# Patient Record
Sex: Female | Born: 1990 | Race: White | Hispanic: No | Marital: Single | Smoking: Current every day smoker
Health system: Southern US, Community
[De-identification: ages and names within clinical notes are randomized; demographics above are authoritative.]

## PROBLEM LIST (undated history)

## (undated) HISTORY — PX: SHOULDER SURGERY: SHX246

## (undated) HISTORY — PX: KIDNEY STONE SURGERY: SHX686

## (undated) HISTORY — PX: CYST EXCISION: SHX5701

## (undated) HISTORY — PX: TONSILLECTOMY: SUR1361

---

## 2018-02-01 ENCOUNTER — Emergency Department (HOSPITAL_BASED_OUTPATIENT_CLINIC_OR_DEPARTMENT_OTHER)
Admission: EM | Admit: 2018-02-01 | Discharge: 2018-02-01 | Disposition: A | Discharge status: Home / Self Care | Payer: BLUE CROSS/BLUE SHIELD | Attending: Emergency Medicine | Admitting: Emergency Medicine

## 2018-02-01 ENCOUNTER — Other Ambulatory Visit: Payer: Self-pay

## 2018-02-01 ENCOUNTER — Encounter (HOSPITAL_BASED_OUTPATIENT_CLINIC_OR_DEPARTMENT_OTHER): Payer: Self-pay | Admitting: Emergency Medicine

## 2018-02-01 ENCOUNTER — Emergency Department (HOSPITAL_BASED_OUTPATIENT_CLINIC_OR_DEPARTMENT_OTHER): Payer: BLUE CROSS/BLUE SHIELD

## 2018-02-01 DIAGNOSIS — R197 Diarrhea, unspecified: Secondary | ICD-10-CM | POA: Diagnosis not present

## 2018-02-01 DIAGNOSIS — R1084 Generalized abdominal pain: Secondary | ICD-10-CM | POA: Diagnosis not present

## 2018-02-01 DIAGNOSIS — F17200 Nicotine dependence, unspecified, uncomplicated: Secondary | ICD-10-CM | POA: Diagnosis not present

## 2018-02-01 LAB — URINALYSIS, ROUTINE W REFLEX MICROSCOPIC
Bilirubin Urine: NEGATIVE
GLUCOSE, UA: NEGATIVE mg/dL
Ketones, ur: NEGATIVE mg/dL
LEUKOCYTES UA: NEGATIVE
NITRITE: NEGATIVE
PROTEIN: NEGATIVE mg/dL
Specific Gravity, Urine: 1.01 (ref 1.005–1.030)
pH: 6.5 (ref 5.0–8.0)

## 2018-02-01 LAB — COMPREHENSIVE METABOLIC PANEL
ALK PHOS: 84 U/L (ref 38–126)
ALT: 9 U/L — ABNORMAL LOW (ref 14–54)
AST: 15 U/L (ref 15–41)
Albumin: 4.2 g/dL (ref 3.5–5.0)
Anion gap: 10 (ref 5–15)
BILIRUBIN TOTAL: 0.3 mg/dL (ref 0.3–1.2)
BUN: 11 mg/dL (ref 6–20)
CALCIUM: 9.3 mg/dL (ref 8.9–10.3)
CO2: 24 mmol/L (ref 22–32)
Chloride: 104 mmol/L (ref 101–111)
Creatinine, Ser: 0.63 mg/dL (ref 0.44–1.00)
Glucose, Bld: 87 mg/dL (ref 65–99)
POTASSIUM: 3.8 mmol/L (ref 3.5–5.1)
Sodium: 138 mmol/L (ref 135–145)
TOTAL PROTEIN: 7.1 g/dL (ref 6.5–8.1)

## 2018-02-01 LAB — CBC WITH DIFFERENTIAL/PLATELET
BASOS ABS: 0 10*3/uL (ref 0.0–0.1)
BASOS PCT: 1 %
EOS ABS: 0.1 10*3/uL (ref 0.0–0.7)
Eosinophils Relative: 2 %
HEMATOCRIT: 41.1 % (ref 36.0–46.0)
HEMOGLOBIN: 14.3 g/dL (ref 12.0–15.0)
Lymphocytes Relative: 33 %
Lymphs Abs: 2.4 10*3/uL (ref 0.7–4.0)
MCH: 32.8 pg (ref 26.0–34.0)
MCHC: 34.8 g/dL (ref 30.0–36.0)
MCV: 94.3 fL (ref 78.0–100.0)
Monocytes Absolute: 0.7 10*3/uL (ref 0.1–1.0)
Monocytes Relative: 10 %
NEUTROS ABS: 3.9 10*3/uL (ref 1.7–7.7)
NEUTROS PCT: 54 %
Platelets: 219 10*3/uL (ref 150–400)
RBC: 4.36 MIL/uL (ref 3.87–5.11)
RDW: 11.3 % — AB (ref 11.5–15.5)
WBC: 7.2 10*3/uL (ref 4.0–10.5)

## 2018-02-01 LAB — URINALYSIS, MICROSCOPIC (REFLEX)
RBC / HPF: NONE SEEN RBC/hpf (ref 0–5)
WBC UA: NONE SEEN WBC/hpf (ref 0–5)

## 2018-02-01 LAB — LIPASE, BLOOD: LIPASE: 25 U/L (ref 11–51)

## 2018-02-01 LAB — PREGNANCY, URINE: PREG TEST UR: NEGATIVE

## 2018-02-01 MED ORDER — SODIUM CHLORIDE 0.9 % IV BOLUS
1000.0000 mL | Freq: Once | INTRAVENOUS | Status: AC
  Administered 2018-02-01: 1000 mL via INTRAVENOUS

## 2018-02-01 MED ORDER — DICYCLOMINE HCL 10 MG PO CAPS
10.0000 mg | ORAL_CAPSULE | Freq: Once | ORAL | Status: AC
  Administered 2018-02-01: 10 mg via ORAL
  Filled 2018-02-01: qty 1

## 2018-02-01 MED ORDER — IOPAMIDOL (ISOVUE-300) INJECTION 61%
100.0000 mL | Freq: Once | INTRAVENOUS | Status: AC | PRN
  Administered 2018-02-01: 100 mL via INTRAVENOUS

## 2018-02-01 MED ORDER — DICYCLOMINE HCL 20 MG PO TABS
20.0000 mg | ORAL_TABLET | Freq: Three times a day (TID) | ORAL | 0 refills | Status: AC
Start: 2018-02-01 — End: ?

## 2018-02-01 NOTE — ED Triage Notes (Signed)
Pt c/o diarrhea since yesterday. Reports 15 episodes today. Denies vomiting.

## 2018-02-01 NOTE — ED Provider Notes (Signed)
[facility] EMERGENCY DEPARTMENT Provider Note   CSN: [id number] Arrival date & time: 02/01/18  1840     History   Chief Complaint Chief Complaint  Patient presents with  . Diarrhea    HPI Carolyn Deleon is a 27 y.o. female who presents today for evaluation of approximately 15 episodes of diarrhea since 2 AM this morning.  She denies nausea or vomiting, states that after multiple episodes of diarrhea her stomach has started hurting.  She denies any fevers at home.  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  She denies knowingly eating any red meat.  Her diarrhea is nonbloody.  She denies any dysuria or hematuria, no increased frequency or urgency.  She denies any vaginal symptoms, no concern for STI/STD, no abnormal vaginal discharge or bleeding.  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    HPI  History reviewed. No pertinent past medical history.  There are no active problems to display for this patient.   Past Surgical History:  Procedure Laterality Date  . TONSILLECTOMY       OB History   None      Home Medications    Prior to Admission medications   Medication Sig Start Date End Date Taking? Authorizing Provider  dicyclomine (BENTYL) 20 MG tablet Take 1 tablet (20 mg total) by mouth 3 (three) times daily before meals. As needed 02/01/18   Cristina GongHammond, Deeanne Deininger W, PA-C    Family History No family history on file.  Social History Social History   Tobacco Use  . Smoking status: Current Every Day Smoker  . Smokeless tobacco: Never Used  Substance Use Topics  . Alcohol use: Not on file  . Drug use: Not Currently    Comment: former opiate addiction     Allergies   Patient has no known allergies.   Review of Systems Review of Systems  Constitutional: Negative for chills  and fever.  Respiratory: Negative for cough and shortness of breath.   Gastrointestinal: Positive for abdominal pain and diarrhea. Negative for nausea and vomiting.  Genitourinary: Negative for dysuria, frequency, genital sores, hematuria, menstrual problem, urgency, vaginal bleeding, vaginal discharge and vaginal pain.  Musculoskeletal: Negative for back pain and neck pain.  Skin: Negative for rash.  Allergic/Immunologic: Negative for immunocompromised state.  Neurological: Negative for weakness, light-headedness and headaches.  Psychiatric/Behavioral: Negative for confusion.  All other systems reviewed and are negative.    Physical Exam Updated Vital Signs BP 116/69 (BP Location: Left Arm)   Pulse 70   Temp 98.4 F (36.9 C) (Oral)   Resp 16   Ht 5\' 8"  (1.727 m)   Wt 59 kg (130 lb)   SpO2 100%   BMI 19.77 kg/m   Physical Exam  Constitutional: She is oriented to person, place, and time. She appears well-developed and well-nourished. No distress.  HENT:  Head: Normocephalic and atraumatic.  Mouth/Throat: Oropharynx is clear and moist.  Eyes: Conjunctivae are normal. Right eye exhibits no discharge. Left eye exhibits no discharge. No scleral icterus.  Neck: Normal range of motion.  Cardiovascular: Normal rate, regular rhythm, normal heart sounds and intact distal pulses.  Pulmonary/Chest: Effort normal. No stridor. No respiratory distress.  Abdominal: Soft. Normal appearance. She exhibits no distension and no mass. Bowel sounds are increased.  There is generalized tenderness and tenderness in the right lower quadrant. There is tenderness at McBurney's point. There is no rebound and no guarding.  Generalized abdominal tenderness, worse in right lower quadrant.   Genitourinary:  Genitourinary Comments: Exam refused by patient.  Musculoskeletal: She exhibits no edema or deformity.  Neurological: She is alert and oriented to person, place, and time. She exhibits normal muscle tone.    Skin: Skin is warm and dry. She is not diaphoretic.  Psychiatric: She has a normal mood and affect. Her behavior is normal.  Nursing note and vitals reviewed.    ED Treatments / Results  Labs (all labs ordered are listed, but only abnormal results are displayed) Labs Reviewed  URINALYSIS, ROUTINE W REFLEX MICROSCOPIC - Abnormal; Notable for the following components:      Result Value   Color, Urine STRAW (*)    Hgb urine dipstick TRACE (*)    All other components within normal limits  URINALYSIS, MICROSCOPIC (REFLEX) - Abnormal; Notable for the following components:   Bacteria, UA FEW (*)    Squamous Epithelial / LPF 0-5 (*)    All other components within normal limits  COMPREHENSIVE METABOLIC PANEL - Abnormal; Notable for the following components:   ALT 9 (*)    All other components within normal limits  CBC WITH DIFFERENTIAL/PLATELET - Abnormal; Notable for the following components:   RDW 11.3 (*)    All other components within normal limits  URINE CULTURE  PREGNANCY, URINE  LIPASE, BLOOD    EKG None  Radiology Ct Abdomen Pelvis W Contrast  Result Date: 02/01/2018 CLINICAL DATA:  Diarrhea since this morning. Generalized abdominal pain. EXAM: CT ABDOMEN AND PELVIS WITH CONTRAST TECHNIQUE: Multidetector CT imaging of the abdomen and pelvis was performed using the standard protocol following bolus administration of intravenous contrast. CONTRAST:  ISOVUE-300 IOPAMIDOL (ISOVUE-300) INJECTION 61% COMPARISON:  None. FINDINGS: Lower chest: Mild dependent changes in the lung bases. Hepatobiliary: No focal liver abnormality is seen. No gallstones, gallbladder wall thickening, or biliary dilatation. Pancreas: Unremarkable. No pancreatic ductal dilatation or surrounding inflammatory changes. Spleen: Normal in size without focal abnormality. Adrenals/Urinary Tract: Adrenal glands are unremarkable. Kidneys are normal, without renal calculi, focal lesion, or hydronephrosis. Bladder is  unremarkable. Stomach/Bowel: Stomach, small bowel, and colon are not abnormally distended. No wall thickening or inflammatory infiltration is identified. Appendix is normal. Vascular/Lymphatic: No significant vascular findings are present. No enlarged abdominal or pelvic lymph nodes. Reproductive: Uterus and bilateral adnexa are unremarkable. Small amount of free fluid in the pelvis is likely to be physiologic. Other: No free air in the abdomen. Abdominal wall musculature appears intact. Musculoskeletal: No acute or significant osseous findings. IMPRESSION: 1. No acute process demonstrated in the abdomen or pelvis. No evidence of bowel obstruction or inflammation. 2. Minimal free fluid in the pelvis is likely physiologic. Electronically Signed   By: Burman Nieves M.D.   On: 02/01/2018 21:56    Procedures Procedures (including critical care time)  Medications Ordered in ED Medications  sodium chloride 0.9 % bolus 1,000 mL (1,000 mLs Intravenous New Bag/Given 02/01/18 2121)  sodium chloride 0.9 % bolus 1,000 mL (1,000 mLs Intravenous New Bag/Given 02/01/18 2124)  dicyclomine (BENTYL) capsule 10 mg (10 mg Oral Given 02/01/18 2123)  iopamidol (ISOVUE-300) 61 % injection 100 mL (100 mLs Intravenous Contrast Given 02/01/18 2137)     Initial Impression / Assessment and Plan / ED Course  I have reviewed the triage vital signs and the nursing notes.  Pertinent labs & imaging results that were available during my care of the patient were reviewed by me and considered in my medical decision making (see chart for details).    Patient is nontoxic, nonseptic appearing, in no apparent distress.  Patient's pain and other symptoms adequately managed in emergency department.  Fluid bolus given.  Labs, imaging and vitals reviewed.  Patient does not meet the SIRS or Sepsis criteria.  Patient has had over 15 episodes of nonbloody diarrhea today.  She has generalized abdominal tenderness to palpation, however it is worse  in the right lower quadrant, patient has never had any abdominal surgeries before.  Her abdominal pain was treated with Bentyl and CAT scan was obtained to rule out appendicitis.  CAT scan unremarkable, no evidence of appendicitis.  On repeat exam after Bentyl, fluids, patient reported feeling significantly better, she passed p.o. challenge without difficulty.  We discussed return precautions, and she stated her understanding.  We discussed risk benefits of Bentyl, and after discussion she will be discharged home with a short course of Bentyl.  She was instructed to make sure she stays well-hydrated.  Patient discharged home.    Final Clinical Impressions(s) / ED Diagnoses   Final diagnoses:  Diarrhea, unspecified type  Generalized abdominal pain    ED Discharge Orders        Ordered    dicyclomine (BENTYL) 20 MG tablet  3 times daily before meals     02/01/18 2303       Cristina Gong, [place] 02/01/18 2321    Arby Barrette, MD 02/07/18 1526

## 2018-02-01 NOTE — ED Notes (Signed)
Patient stated that she can not have any opiates because she is a recovering addict.

## 2018-02-01 NOTE — Discharge Instructions (Addendum)
Please make sure you are staying well hydrated.  Please follow up with your doctor as needed.  If you have any additional concerns or your diarrhea continues please seek additional medical evaluation.

## 2018-02-03 LAB — URINE CULTURE

## 2018-03-22 ENCOUNTER — Other Ambulatory Visit: Payer: Self-pay

## 2018-03-22 ENCOUNTER — Encounter (HOSPITAL_BASED_OUTPATIENT_CLINIC_OR_DEPARTMENT_OTHER): Payer: Self-pay | Admitting: *Deleted

## 2018-03-22 ENCOUNTER — Emergency Department (HOSPITAL_BASED_OUTPATIENT_CLINIC_OR_DEPARTMENT_OTHER)
Admission: EM | Admit: 2018-03-22 | Discharge: 2018-03-22 | Disposition: A | Discharge status: Home / Self Care | Payer: Worker's Compensation | Attending: Emergency Medicine | Admitting: Emergency Medicine

## 2018-03-22 DIAGNOSIS — Y990 Civilian activity done for income or pay: Secondary | ICD-10-CM | POA: Diagnosis not present

## 2018-03-22 DIAGNOSIS — Y939 Activity, unspecified: Secondary | ICD-10-CM | POA: Diagnosis not present

## 2018-03-22 DIAGNOSIS — T24512A Corrosion of first degree of left thigh, initial encounter: Secondary | ICD-10-CM | POA: Diagnosis not present

## 2018-03-22 DIAGNOSIS — T24412A Corrosion of unspecified degree of left thigh, initial encounter: Secondary | ICD-10-CM | POA: Diagnosis present

## 2018-03-22 DIAGNOSIS — Y929 Unspecified place or not applicable: Secondary | ICD-10-CM | POA: Insufficient documentation

## 2018-03-22 DIAGNOSIS — Y33XXXA Other specified events, undetermined intent, initial encounter: Secondary | ICD-10-CM | POA: Insufficient documentation

## 2018-03-22 DIAGNOSIS — F17210 Nicotine dependence, cigarettes, uncomplicated: Secondary | ICD-10-CM | POA: Diagnosis not present

## 2018-03-22 DIAGNOSIS — T24511A Corrosion of first degree of right thigh, initial encounter: Secondary | ICD-10-CM | POA: Insufficient documentation

## 2018-03-22 DIAGNOSIS — T304 Corrosion of unspecified body region, unspecified degree: Secondary | ICD-10-CM

## 2018-03-22 DIAGNOSIS — T65891A Toxic effect of other specified substances, accidental (unintentional), initial encounter: Secondary | ICD-10-CM | POA: Insufficient documentation

## 2018-03-22 MED ORDER — SILVER SULFADIAZINE 1 % EX CREA
TOPICAL_CREAM | Freq: Once | CUTANEOUS | Status: AC
  Administered 2018-03-22: 1 via TOPICAL
  Filled 2018-03-22: qty 85
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
## 2018-03-24 ENCOUNTER — Emergency Department (HOSPITAL_BASED_OUTPATIENT_CLINIC_OR_DEPARTMENT_OTHER)
Admission: EM | Admit: 2018-03-24 | Discharge: 2018-03-24 | Disposition: A | Discharge status: Home / Self Care | Payer: Worker's Compensation | Attending: Emergency Medicine | Admitting: Emergency Medicine

## 2018-03-24 ENCOUNTER — Encounter (HOSPITAL_BASED_OUTPATIENT_CLINIC_OR_DEPARTMENT_OTHER): Payer: Self-pay

## 2018-03-24 ENCOUNTER — Other Ambulatory Visit: Payer: Self-pay

## 2018-03-24 DIAGNOSIS — Y929 Unspecified place or not applicable: Secondary | ICD-10-CM | POA: Insufficient documentation

## 2018-03-24 DIAGNOSIS — T24611D Corrosion of second degree of right thigh, subsequent encounter: Secondary | ICD-10-CM

## 2018-03-24 DIAGNOSIS — T542X1D Toxic effect of corrosive acids and acid-like substances, accidental (unintentional), subsequent encounter: Secondary | ICD-10-CM | POA: Diagnosis not present

## 2018-03-24 DIAGNOSIS — X58XXXD Exposure to other specified factors, subsequent encounter: Secondary | ICD-10-CM | POA: Diagnosis not present

## 2018-03-24 DIAGNOSIS — F17210 Nicotine dependence, cigarettes, uncomplicated: Secondary | ICD-10-CM | POA: Insufficient documentation

## 2018-03-24 DIAGNOSIS — T24612D Corrosion of second degree of left thigh, subsequent encounter: Secondary | ICD-10-CM | POA: Insufficient documentation

## 2018-03-24 DIAGNOSIS — L03116 Cellulitis of left lower limb: Secondary | ICD-10-CM | POA: Diagnosis not present

## 2018-03-24 DIAGNOSIS — T24039D Burn of unspecified degree of unspecified lower leg, subsequent encounter: Secondary | ICD-10-CM | POA: Diagnosis present

## 2018-03-24 MED ORDER — CEPHALEXIN 500 MG PO CAPS: 500.0000 mg | ORAL_CAPSULE | Freq: Four times a day (QID) | ORAL | 0 refills | Status: AC

## 2018-03-24 NOTE — Discharge Instructions (Addendum)
Thank you for allowing me to care of you today in the emergency department.  Take 1 tablet of Keflex every 6 hours for the next 7 days.  The redness has been marked with a skin marker today in the emergency department.  After you take in 3-4 doses of the antibiotics, he should not have any  extension of redness.  Continue to use the Silvadene cream as prescribed.  Keep the area clean with warm soap and water.  You can apply a bandage to the area to keep it from rubbing against the material on your pants.  MSDS numbers were [id number] which is tetrapotassium pyrophosphate, potassium hydroxide, and edta.  The 2nd one was [id number] which is ammonia.   Return to the emergency department if your symptoms worsen despite being on antibiotics, if you develop fever chills, or other new concerning symptoms.
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
## 2018-06-09 ENCOUNTER — Encounter (HOSPITAL_BASED_OUTPATIENT_CLINIC_OR_DEPARTMENT_OTHER): Payer: Self-pay | Admitting: Emergency Medicine

## 2018-06-09 ENCOUNTER — Emergency Department (HOSPITAL_BASED_OUTPATIENT_CLINIC_OR_DEPARTMENT_OTHER)
Admission: EM | Admit: 2018-06-09 | Discharge: 2018-06-09 | Disposition: A | Discharge status: Home / Self Care | Payer: Worker's Compensation | Attending: Emergency Medicine | Admitting: Emergency Medicine

## 2018-06-09 ENCOUNTER — Other Ambulatory Visit: Payer: Self-pay

## 2018-06-09 ENCOUNTER — Emergency Department (HOSPITAL_BASED_OUTPATIENT_CLINIC_OR_DEPARTMENT_OTHER): Payer: Worker's Compensation

## 2018-06-09 DIAGNOSIS — F17200 Nicotine dependence, unspecified, uncomplicated: Secondary | ICD-10-CM | POA: Diagnosis not present

## 2018-06-09 DIAGNOSIS — Y929 Unspecified place or not applicable: Secondary | ICD-10-CM | POA: Insufficient documentation

## 2018-06-09 DIAGNOSIS — S61215A Laceration without foreign body of left ring finger without damage to nail, initial encounter: Secondary | ICD-10-CM | POA: Diagnosis not present

## 2018-06-09 DIAGNOSIS — Z79899 Other long term (current) drug therapy: Secondary | ICD-10-CM | POA: Diagnosis not present

## 2018-06-09 DIAGNOSIS — Y990 Civilian activity done for income or pay: Secondary | ICD-10-CM | POA: Diagnosis not present

## 2018-06-09 DIAGNOSIS — W208XXA Other cause of strike by thrown, projected or falling object, initial encounter: Secondary | ICD-10-CM | POA: Insufficient documentation

## 2018-06-09 DIAGNOSIS — Y9389 Activity, other specified: Secondary | ICD-10-CM | POA: Insufficient documentation

## 2018-06-09 DIAGNOSIS — Z23 Encounter for immunization: Secondary | ICD-10-CM | POA: Insufficient documentation

## 2018-06-09 DIAGNOSIS — R52 Pain, unspecified: Secondary | ICD-10-CM

## 2018-06-09 MED ORDER — TETANUS-DIPHTH-ACELL PERTUSSIS 5-2.5-18.5 LF-MCG/0.5 IM SUSP
0.5000 mL | Freq: Once | INTRAMUSCULAR | Status: AC
  Administered 2018-06-09: 0.5 mL via INTRAMUSCULAR
  Filled 2018-06-09: qty 0.5

## 2018-06-09 MED ORDER — LIDOCAINE HCL (PF) 1 % IJ SOLN
5.0000 mL | Freq: Once | INTRAMUSCULAR | Status: AC
Start: 2018-06-09 — End: 2018-06-09
  Administered 2018-06-09: 5 mL
  Filled 2018-06-09: qty 5

## 2018-06-09 NOTE — ED Provider Notes (Signed)
[facility] EMERGENCY DEPARTMENT Provider Note   CSN: [id number] Arrival date & time: 06/09/18  1710     History   Chief Complaint Chief Complaint  Patient presents with  . Finger Injury    HPI Carolyn Deleon is a 27 y.o. female.  The history is provided by the patient.  Hand Injury   The incident occurred less than 1 hour ago. The incident occurred at work. The injury mechanism was a direct blow (Patient had metal equipment fall on left hand, palm side up and has laceration to hand. Light object. Wound stopped bleeding after pressure. ). The pain is present in the left hand. The quality of the pain is described as aching. The pain is at a severity of 2/10. The pain is mild. The pain has been fluctuating since the incident. Pertinent negatives include no fever. She reports no foreign bodies present. The symptoms are aggravated by palpation. She has tried nothing for the symptoms. The treatment provided no relief.    History reviewed. No pertinent past medical history.  There are no active problems to display for this patient.   Past Surgical History:  Procedure Laterality Date  . CYST EXCISION    . KIDNEY STONE SURGERY    . SHOULDER SURGERY    . TONSILLECTOMY       OB History   None      Home Medications    Prior to Admission medications   Medication Sig Start Date End Date Taking? Authorizing Provider  gabapentin (NEURONTIN) 100 MG capsule Take 100 mg by mouth 3 (three) times daily.   Yes [provider]  traZODone (DESYREL) 100 MG tablet Take 100 mg by mouth at bedtime.   Yes [provider]  dicyclomine (BENTYL) 20 MG tablet Take 1 tablet (20 mg total) by mouth 3 (three) times daily before meals. As needed 02/01/18   Cristina Gong, PA-C    Family History History reviewed. No pertinent family history.  Social History Social History   Tobacco Use  . Smoking status: Current Every Day Smoker  . Smokeless tobacco: Never Used    Substance Use Topics  . Alcohol use: Not Currently  . Drug use: Not Currently    Comment: former opiate addiction     Allergies   Patient has no known allergies.   Review of Systems Review of Systems  Constitutional: Negative for chills and fever.  HENT: Negative for ear pain and sore throat.   Eyes: Negative for pain and visual disturbance.  Respiratory: Negative for cough and shortness of breath.   Cardiovascular: Negative for chest pain and palpitations.  Gastrointestinal: Negative for abdominal pain and vomiting.  Genitourinary: Negative for dysuria and hematuria.  Musculoskeletal: Negative for arthralgias and back pain.  Skin: Positive for wound. Negative for color change and rash.  Neurological: Negative for seizures and syncope.  All other systems reviewed and are negative.    Physical Exam Updated Vital Signs  ED Triage Vitals  Enc Vitals Group     BP 06/09/18 1718 116/66     Pulse Rate 06/09/18 1718 72     Resp 06/09/18 1718 18     Temp 06/09/18 1718 98.3 F (36.8 C)     Temp Source 06/09/18 1718 Oral     SpO2 06/09/18 1718 100 %     Weight 06/09/18 1719 140 lb (63.5 kg)     Height 06/09/18 1719 5\' 8"  (1.727 m)     Head Circumference --  Peak Flow --      Pain Score 06/09/18 1718 5     Pain Loc --      Pain Edu? --      Excl. in GC? --     Physical Exam  Constitutional: She is oriented to person, place, and time. She appears well-developed and well-nourished. No distress.  HENT:  Head: Normocephalic and atraumatic.  Eyes: Pupils are equal, round, and reactive to light. Conjunctivae are normal.  Neck: Neck supple.  Cardiovascular: Normal rate and regular rhythm.  No murmur heard. Pulmonary/Chest: Effort normal and breath sounds normal. No respiratory distress.  Abdominal: Soft. There is no tenderness.  Musculoskeletal: Normal range of motion. She exhibits tenderness (TTP to left middle, ring and pinky finger). She exhibits no edema.   Neurological: She is alert and oriented to person, place, and time.  5+/5 strength in left hand with normal sensation of left hand  Skin: Skin is warm and dry.  Laceration to left ring finger about 1 cm and hemostatic, small abrasion to left pinky and ring finger  Psychiatric: She has a normal mood and affect.  Nursing note and vitals reviewed.    ED Treatments / Results  Labs (all labs ordered are listed, but only abnormal results are displayed) Labs Reviewed - No data to display  EKG None  Radiology Dg Hand Complete Left  Result Date: 06/09/2018 CLINICAL DATA:  Heavy object fell on left hand. Lacerations to 3rd, 4th and 5th digits. EXAM: LEFT HAND - COMPLETE 3+ VIEW COMPARISON:  None. FINDINGS: There is no evidence of fracture or dislocation. There is no evidence of arthropathy or other focal bone abnormality. Soft tissues are unremarkable. No radiopaque foreign body. IMPRESSION: Negative. Electronically Signed   By: Charlett NoseKevin  Dover M.D.   On: 06/09/2018 18:13    Procedures .Marland Kitchen.Laceration Repair Date/Time: 06/09/2018 7:00 PM Performed by: Virgina Norfolkuratolo, Kyndall Amero, DO Authorized by: Virgina Norfolkuratolo, Nehan Flaum, DO   Consent:    Consent obtained:  Verbal   Consent given by:  Patient   Risks discussed:  Infection, need for additional repair, nerve damage, pain, poor cosmetic result, poor wound healing, retained foreign body, tendon damage and vascular damage   Alternatives discussed:  No treatment Anesthesia (see MAR for exact dosages):    Anesthesia method:  None Laceration details:    Location:  Finger   Finger location:  L ring finger   Length (cm):  1 Repair type:    Repair type:  Simple Pre-procedure details:    Preparation:  Patient was prepped and draped in usual sterile fashion Exploration:    Wound exploration: wound explored through full range of motion and entire depth of wound probed and visualized     Wound extent: no areolar tissue violation noted, no fascia violation noted, no  foreign bodies/material noted, no muscle damage noted, no nerve damage noted, no tendon damage noted, no underlying fracture noted and no vascular damage noted     Contaminated: no   Treatment:    Area cleansed with:  Saline   Amount of cleaning:  Standard   Irrigation solution:  Sterile saline   Irrigation method:  Pressure wash Skin repair:    Repair method:  Steri-Strips and tissue adhesive   Number of Steri-Strips:  3 Approximation:    Approximation:  Close Post-procedure details:    Dressing:  Open (no dressing)   Patient tolerance of procedure:  Tolerated well, no immediate complications   (including critical care time)  Medications Ordered in ED Medications  Tdap (BOOSTRIX) injection 0.5 mL (0.5 mLs Intramuscular Given 06/09/18 1807)  lidocaine (PF) (XYLOCAINE) 1 % injection 5 mL (5 mLs Infiltration Given by Other 06/09/18 1808)     Initial Impression / Assessment and Plan / ED Course  I have reviewed the triage vital signs and the nursing notes.  Pertinent labs & imaging results that were available during my care of the patient were reviewed by me and considered in my medical decision making (see chart for details).     Carolyn Deleon is a 27 year old female with no significant medical history presents to the ED with injury to left hand.  Patient with normal vitals.  No fever.  Patient was cut at work by a metal object that she was working on.  Patient states that object is light.  Patient has laceration to left ring finger that is hemostatic and has abrasions to the middle finger and ring finger.  Patient is neurovascularly intact and neurologically intact in the left hand. No concern for tendon or ligament involved, normal ROM under examination. X-rays of the left hand show no acute fractures or malalignment.  Wound was repaired with Dermabond.  Tetanus shot was given.  Patient given wound care instructions and given infectious signs to watch out for.  Discharged from ED in good  condition.  Understands return precautions.  Final Clinical Impressions(s) / ED Diagnoses   Final diagnoses:  Pain  Laceration of left ring finger without foreign body without damage to nail, initial encounter    ED Discharge Orders    None       Virgina NorfolkCuratolo, Teddi Badalamenti, DO 06/09/18 1906

## 2018-06-09 NOTE — ED Triage Notes (Signed)
Patient states that she was working on a machine and hit fell onto her left hand - patient has laceration to her middle, ring and pinky fingers

## 2018-07-21 ENCOUNTER — Encounter (HOSPITAL_BASED_OUTPATIENT_CLINIC_OR_DEPARTMENT_OTHER): Payer: Self-pay | Admitting: *Deleted

## 2018-07-21 ENCOUNTER — Other Ambulatory Visit: Payer: Self-pay

## 2018-07-21 ENCOUNTER — Emergency Department (HOSPITAL_BASED_OUTPATIENT_CLINIC_OR_DEPARTMENT_OTHER): Payer: BLUE CROSS/BLUE SHIELD

## 2018-07-21 ENCOUNTER — Emergency Department (HOSPITAL_BASED_OUTPATIENT_CLINIC_OR_DEPARTMENT_OTHER)
Admission: EM | Admit: 2018-07-21 | Discharge: 2018-07-21 | Disposition: A | Discharge status: Home / Self Care | Payer: BLUE CROSS/BLUE SHIELD | Attending: Emergency Medicine | Admitting: Emergency Medicine

## 2018-07-21 DIAGNOSIS — R1032 Left lower quadrant pain: Secondary | ICD-10-CM | POA: Insufficient documentation

## 2018-07-21 DIAGNOSIS — F17200 Nicotine dependence, unspecified, uncomplicated: Secondary | ICD-10-CM | POA: Diagnosis not present

## 2018-07-21 DIAGNOSIS — N200 Calculus of kidney: Secondary | ICD-10-CM | POA: Diagnosis not present

## 2018-07-21 DIAGNOSIS — R109 Unspecified abdominal pain: Secondary | ICD-10-CM

## 2018-07-21 LAB — URINALYSIS, ROUTINE W REFLEX MICROSCOPIC
Bilirubin Urine: NEGATIVE
GLUCOSE, UA: NEGATIVE mg/dL
Ketones, ur: NEGATIVE mg/dL
LEUKOCYTES UA: NEGATIVE
Nitrite: NEGATIVE
PH: 7 (ref 5.0–8.0)
Protein, ur: NEGATIVE mg/dL
SPECIFIC GRAVITY, URINE: 1.015 (ref 1.005–1.030)

## 2018-07-21 LAB — URINALYSIS, MICROSCOPIC (REFLEX)

## 2018-07-21 LAB — PREGNANCY, URINE: Preg Test, Ur: NEGATIVE

## 2018-07-21 MED ORDER — SODIUM CHLORIDE 0.9 % IV SOLN: INTRAVENOUS | Status: DC

## 2018-07-21 MED ORDER — KETOROLAC TROMETHAMINE 30 MG/ML IJ SOLN
30.0000 mg | Freq: Once | INTRAMUSCULAR | Status: AC
  Administered 2018-07-21: 30 mg via INTRAMUSCULAR
  Filled 2018-07-21: qty 1

## 2018-07-21 NOTE — ED Notes (Signed)
ED Provider at bedside. 

## 2018-07-21 NOTE — ED Provider Notes (Signed)
[facility] EMERGENCY DEPARTMENT Provider Note   CSN: [id number] Arrival date & time: 07/21/18  1259     History   Chief Complaint Chief Complaint  Patient presents with  . Flank Pain    HPI Carolyn Deleon is a 27 y.o. female.  Patient c/o left flank pain, for past week. Constant, dull, mod-severe, non radiating. Feels similar to prior kidney stone pain. No dysuria or hematuria. No fever or chills. Nausea, no vomiting.   The history is provided by the patient.  Flank Pain  Pertinent negatives include no chest pain, no abdominal pain, no headaches and no shortness of breath.    History reviewed. No pertinent past medical history.  There are no active problems to display for this patient.   Past Surgical History:  Procedure Laterality Date  . CYST EXCISION    . KIDNEY STONE SURGERY    . SHOULDER SURGERY    . TONSILLECTOMY       OB History   None      Home Medications    Prior to Admission medications   Medication Sig Start Date End Date Taking? Authorizing Provider  dicyclomine (BENTYL) 20 MG tablet Take 1 tablet (20 mg total) by mouth 3 (three) times daily before meals. As needed 02/01/18  Yes Cristina GongHammond, Elizabeth W, PA-C  gabapentin (NEURONTIN) 100 MG capsule Take 100 mg by mouth 3 (three) times daily.   Yes [provider]  traZODone (DESYREL) 100 MG tablet Take 100 mg by mouth at bedtime.   Yes [provider]    Family History No family history on file.  Social History Social History   Tobacco Use  . Smoking status: Current Every Day Smoker  . Smokeless tobacco: Never Used  Substance Use Topics  . Alcohol use: Not Currently  . Drug use: Not Currently    Comment: former opiate addiction     Allergies   Patient has no known allergies.   Review of Systems Review of Systems  Constitutional: Negative for fever.  HENT: Negative for sore throat.   Eyes: Negative for redness.  Respiratory: Negative for shortness of  breath.   Cardiovascular: Negative for chest pain.  Gastrointestinal: Negative for abdominal pain.  Genitourinary: Positive for flank pain.  Musculoskeletal: Negative for neck pain.  Skin: Negative for rash.  Neurological: Negative for headaches.  Hematological: Does not bruise/bleed easily.  Psychiatric/Behavioral: Negative for confusion.     Physical Exam Updated Vital Signs BP 114/71   Pulse 89   Temp 98 F (36.7 C) (Oral)   Resp 20   Ht 1.727 m (5\' 8" )   Wt 63.5 kg   SpO2 99%   BMI 21.29 kg/m   Physical Exam  Constitutional: She appears well-developed and well-nourished.  HENT:  Nose: Nose normal.  Eyes: Conjunctivae are normal. No scleral icterus.  Neck: Neck supple. No tracheal deviation present.  Cardiovascular: Normal rate, regular rhythm, normal heart sounds and intact distal pulses. Exam reveals no gallop and no friction rub.  No murmur heard. Pulmonary/Chest: Effort normal and breath sounds normal. No respiratory distress.  Abdominal: Soft. Normal appearance and bowel sounds are normal. She exhibits no distension. There is no tenderness.  Genitourinary:  Genitourinary Comments: No cva tenderness  Musculoskeletal: She exhibits no edema.  Neurological: She is alert.  Skin: Skin is warm and dry. No rash noted.  Psychiatric: She has a normal mood and affect.  Nursing note and vitals reviewed.    ED Treatments / Results  Labs (  all labs ordered are listed, but only abnormal results are displayed) Results for orders placed or performed during the hospital encounter of 07/21/18  Urinalysis, Routine w reflex microscopic  Result Value Ref Range   Color, Urine YELLOW YELLOW   APPearance CLEAR CLEAR   Specific Gravity, Urine 1.015 1.005 - 1.030   pH 7.0 5.0 - 8.0   Glucose, UA NEGATIVE NEGATIVE mg/dL   Hgb urine dipstick TRACE (A) NEGATIVE   Bilirubin Urine NEGATIVE NEGATIVE   Ketones, ur NEGATIVE NEGATIVE mg/dL   Protein, ur NEGATIVE NEGATIVE mg/dL    Nitrite NEGATIVE NEGATIVE   Leukocytes, UA NEGATIVE NEGATIVE  Pregnancy, urine  Result Value Ref Range   Preg Test, Ur NEGATIVE NEGATIVE  Urinalysis, Microscopic (reflex)  Result Value Ref Range   RBC / HPF 0-5 0 - 5 RBC/hpf   WBC, UA 0-5 0 - 5 WBC/hpf   Bacteria, UA RARE (A) NONE SEEN   Squamous Epithelial / LPF 6-10 0 - 5   Mucus PRESENT     EKG None  Radiology Ct Renal Stone Study  Result Date: 07/21/2018 CLINICAL DATA:  Flank pain EXAM: CT ABDOMEN AND PELVIS WITHOUT CONTRAST TECHNIQUE: Multidetector CT imaging of the abdomen and pelvis was performed following the standard protocol without IV contrast. COMPARISON:  02/01/2018 FINDINGS: Lower chest: No acute abnormality. Hepatobiliary: No focal liver abnormality is seen. No gallstones, gallbladder wall thickening, or biliary dilatation. Pancreas: Unremarkable. No pancreatic ductal dilatation or surrounding inflammatory changes. Spleen: Normal in size without focal abnormality. Adrenals/Urinary Tract: Normal bilateral adrenal glands. Nonobstructing bilateral renal calculi measuring up to 4 mm in the left upper pole and 1-2 mm in the lower pole the right kidney. No hydroureteronephrosis. No ureteral calcification. No bladder calculus or focal mural thickening. Stomach/Bowel: Stomach is within normal limits. Appendix appears normal. Moderate fecal retention within the colon. No evidence of bowel wall thickening, distention, or inflammatory changes. Vascular/Lymphatic: No significant vascular findings are present. No enlarged abdominal or pelvic lymph nodes. Reproductive: Uterus and bilateral adnexa are unremarkable. Other: No abdominal wall hernia or abnormality. No abdominopelvic ascites. Musculoskeletal: No acute or significant osseous findings. IMPRESSION: Nonobstructing bilateral renal calculi.  No hydroureteronephrosis. Moderate stool retention within the colon, query constipation. Electronically Signed   By: Tollie Eth M.D.   On: 07/21/2018  15:02    Procedures Procedures (including critical care time)  Medications Ordered in ED Medications  0.9 %  sodium chloride infusion (has no administration in time range)  ketorolac (TORADOL) 30 MG/ML injection 30 mg (has no administration in time range)     Initial Impression / Assessment and Plan / ED Course  I have reviewed the triage vital signs and the nursing notes.  Pertinent labs & imaging results that were available during my care of the patient were reviewed by me and considered in my medical decision making (see chart for details).  Labs. Ct.  Toradol.   Reviewed nursing notes and prior charts for additional history.   Recheck pain improved.  Ct reviewed - no obstructing stone.  Labs reviewed - ua neg.  Recheck comfortable appearing. Stable for d/c.     Final Clinical Impressions(s) / ED Diagnoses   Final diagnoses:  None    ED Discharge Orders    None       Cathren Laine, MD 07/21/18 1529

## 2018-07-21 NOTE — ED Notes (Signed)
May hold IV at this time per v.o. Dr. Denton LankSteinl.

## 2018-07-21 NOTE — ED Triage Notes (Signed)
Left flank pain for a week. Hx of kidney stones.

## 2018-07-21 NOTE — Discharge Instructions (Addendum)
It was our pleasure to provide your ER care today - we hope that you feel better.  Drink plenty of fluids.  Take ibuprofen or aleve as need for pain.  Follow up with primary care doctor in the coming week.  Return to ER if worse, new symptoms, fevers, intractable pain, other concern.

## 2018-09-08 ENCOUNTER — Ambulatory Visit (INDEPENDENT_AMBULATORY_CARE_PROVIDER_SITE_OTHER): Payer: BLUE CROSS/BLUE SHIELD | Admitting: Family Medicine

## 2018-09-08 ENCOUNTER — Encounter: Payer: Self-pay | Admitting: Family Medicine

## 2018-09-08 VITALS — BP 124/69 | HR 89 | Ht 68.0 in | Wt 141.1 lb

## 2018-09-08 DIAGNOSIS — Z01419 Encounter for gynecological examination (general) (routine) without abnormal findings: Secondary | ICD-10-CM | POA: Diagnosis not present

## 2018-09-08 DIAGNOSIS — Z124 Encounter for screening for malignant neoplasm of cervix: Secondary | ICD-10-CM | POA: Diagnosis not present

## 2018-09-08 DIAGNOSIS — N644 Mastodynia: Secondary | ICD-10-CM

## 2018-09-08 MED ORDER — MEDROXYPROGESTERONE ACETATE 150 MG/ML IM SUSP: 150.0000 mg | INTRAMUSCULAR | 3 refills | Status: DC

## 2018-09-08 NOTE — Progress Notes (Signed)
GYNECOLOGY ANNUAL PREVENTATIVE CARE ENCOUNTER NOTE  Subjective:   Carolyn Deleon is a 28 y.o. G0P0000 female here for a routine annual gynecologic exam.  Current complaints: breast tenderness.   Denies abnormal vaginal bleeding, discharge, pelvic pain, problems with intercourse or other gynecologic concerns.    Gynecologic History No LMP recorded. Patient has had an injection. Patient is not sexually active  Contraception: Depo-Provera injections Last Pap: 2016. Results were: normal Last mammogram: n/a.  Obstetric History OB History  Gravida Para Term Preterm AB Living  0 0 0 0 0 0  SAB TAB Ectopic Multiple Live Births  0 0 0 0 0    History reviewed. No pertinent past medical history.  Past Surgical History:  Procedure Laterality Date  . CYST EXCISION    . KIDNEY STONE SURGERY    . SHOULDER SURGERY    . TONSILLECTOMY      Current Outpatient Medications on File Prior to Visit  Medication Sig Dispense Refill  . dicyclomine (BENTYL) 20 MG tablet Take 1 tablet (20 mg total) by mouth 3 (three) times daily before meals. As needed 20 tablet 0  . gabapentin (NEURONTIN) 100 MG capsule Take 100 mg by mouth 3 (three) times daily.    Marland Kitchen oxcarbazepine (TRILEPTAL) 600 MG tablet Take 600 mg by mouth 2 (two) times daily.    . pramipexole (MIRAPEX) 0.75 MG tablet Take 0.75 mg by mouth 3 (three) times daily.    . traZODone (DESYREL) 100 MG tablet Take 100 mg by mouth at bedtime.     No current facility-administered medications on file prior to visit.     Allergies  Allergen Reactions  . Oxycodone Rash    Only has problems after taking for long periods of time     Social History   Socioeconomic History  . Marital status: Single    Spouse name: Not on file  . Number of children: Not on file  . Years of education: Not on file  . Highest education level: Not on file  Occupational History  . Not on file  Social Needs  . Financial resource strain: Not on file  . Food  insecurity:    Worry: Not on file    Inability: Not on file  . Transportation needs:    Medical: Not on file    Non-medical: Not on file  Tobacco Use  . Smoking status: Current Every Day Smoker  . Smokeless tobacco: Never Used  Substance and Sexual Activity  . Alcohol use: Not Currently  . Drug use: Not Currently    Comment: former opiate addiction  . Sexual activity: Not Currently    Birth control/protection: Injection  Lifestyle  . Physical activity:    Days per week: Not on file    Minutes per session: Not on file  . Stress: Not on file  Relationships  . Social connections:    Talks on phone: Not on file    Gets together: Not on file    Attends religious service: Not on file    Active member of club or organization: Not on file    Attends meetings of clubs or organizations: Not on file    Relationship status: Not on file  . Intimate partner violence:    Fear of current or ex partner: Not on file    Emotionally abused: Not on file    Physically abused: Not on file    Forced sexual activity: Not on file  Other Topics Concern  . Not  on file  Social History Narrative  . Not on file    Family History  Problem Relation Age of Onset  . Hypertension Father   . Heart disease Father   . Diabetes Father   . Hypertension Mother   . Hypothyroidism Mother     The following portions of the patient's history were reviewed and updated as appropriate: allergies, current medications, past family history, past medical history, past social history, past surgical history and problem list.  Review of Systems Pertinent items are noted in HPI.   Objective:  BP 124/69   Pulse 89   Ht 5\' 8"  (1.727 m)   Wt 141 lb 1.3 oz (64 kg)   BMI 21.45 kg/m  CONSTITUTIONAL: Well-developed, well-nourished female in no acute distress.  HENT:  Normocephalic, atraumatic, External right and left ear normal. Oropharynx is clear and moist EYES: Conjunctivae and EOM are normal. Pupils are equal,  round, and reactive to light. No scleral icterus.  NECK: Normal range of motion, supple, no masses.  Normal thyroid.   CARDIOVASCULAR: Normal heart rate noted, regular rhythm RESPIRATORY: Clear to auscultation bilaterally. Effort and breath sounds normal, no problems with respiration noted. BREASTS: Symmetric in size. No masses, skin changes, nipple drainage, or lymphadenopathy. ABDOMEN: Soft, normal bowel sounds, no distention noted.  No tenderness, rebound or guarding.  PELVIC: Normal appearing external genitalia; normal appearing vaginal mucosa and cervix.  No abnormal discharge noted.  Normal uterine size, no other palpable masses, no uterine or adnexal tenderness. MUSCULOSKELETAL: Normal range of motion. No tenderness.  No cyanosis, clubbing, or edema.  2+ distal pulses. SKIN: Skin is warm and dry. No rash noted. Not diaphoretic. No erythema. No pallor. NEUROLOGIC: Alert and oriented to person, place, and time. Normal reflexes, muscle tone coordination. No cranial nerve deficit noted. PSYCHIATRIC: Normal mood and affect. Normal behavior. Normal judgment and thought content.  Assessment:  Annual gynecologic examination with pap smear   Plan:  1. Women's annual routine gynecological examination STD testing declined PAP done today - will follow results Refill Depo provera  2. Breast tenderness in female Vit E   Routine preventative health maintenance measures emphasized. Please refer to After Visit Summary for other counseling recommendations.    Carolyn Celeste, DO [facility]

## 2018-09-08 NOTE — Addendum Note (Signed)
Addended by: Anell Barr on: 09/08/2018 11:44 AM   Modules accepted: Orders

## 2018-09-09 LAB — CYTOLOGY - PAP
ADEQUACY: ABSENT
Diagnosis: NEGATIVE

## 2018-09-29 ENCOUNTER — Ambulatory Visit: Payer: Self-pay

## 2019-02-27 IMAGING — CT CT ABD-PELV W/ CM
2 of 4 series · 16 of 46 positions shown, 18 images · IV contrast (APPLIED)
Comparison: None.

CLINICAL DATA: Diarrhea since this morning. Generalized abdominal
pain.

EXAM:
CT ABDOMEN AND PELVIS WITH CONTRAST
TECHNIQUE: Multidetector CT imaging of the abdomen and pelvis was performed
using the standard protocol following bolus administration of
intravenous contrast.
CONTRAST:  100mL [id number] IOPAMIDOL ([id number]-UMM) INJECTION 61%

[Series 2: axial st · axial · 0.70mm/px · z∈[-457,-52]mm · 13 of 89 slices shown, 15 images]
[im 4/89  soft-tissue]
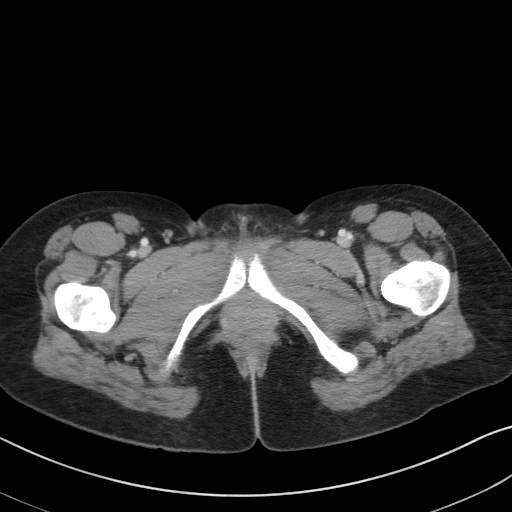
[im 4/89  bone]
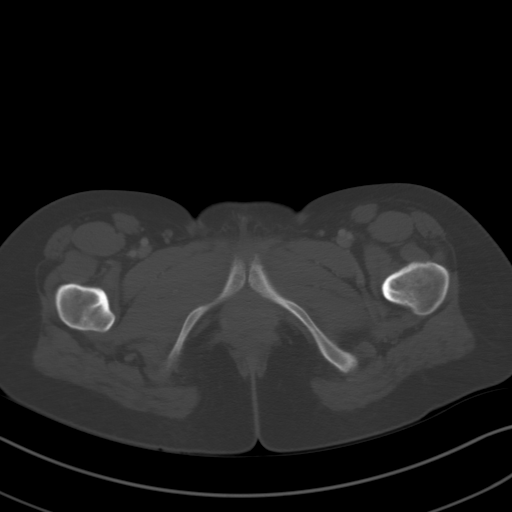
[im 11/89  soft-tissue]
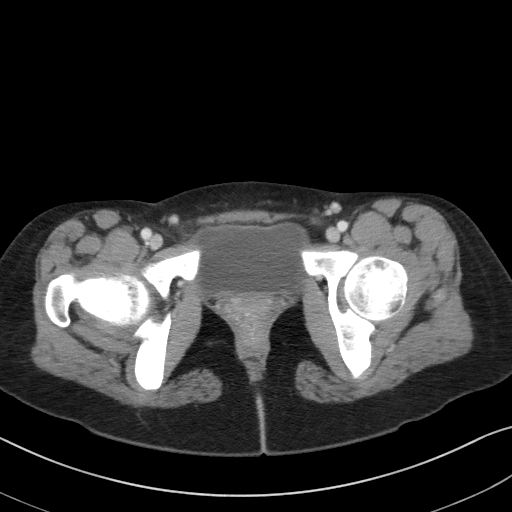
[im 18/89  soft-tissue]
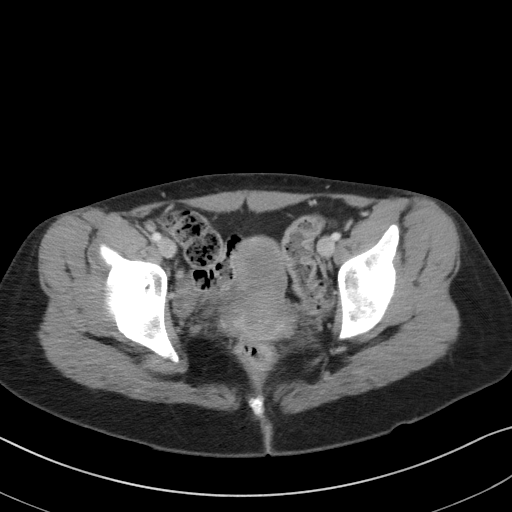
[im 25/89  soft-tissue]
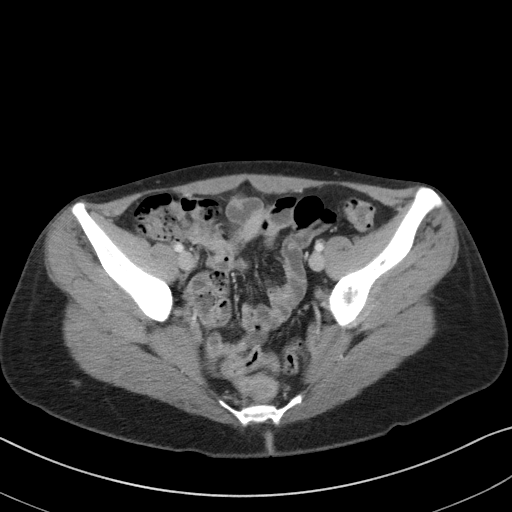
[im 32/89  soft-tissue]
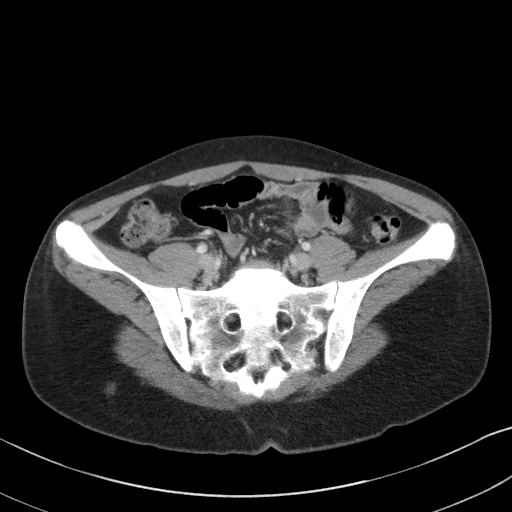
[im 39/89  soft-tissue]
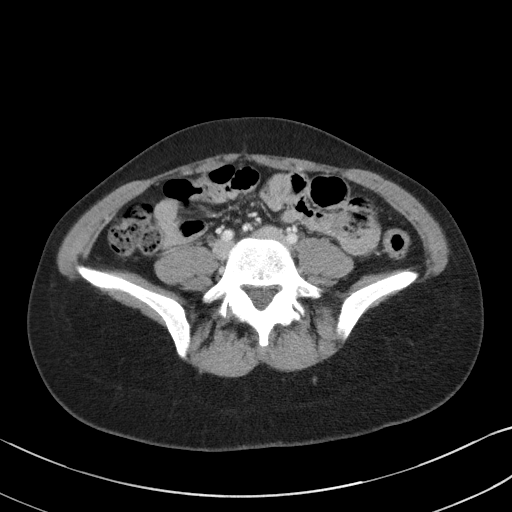
[im 46/89  soft-tissue]
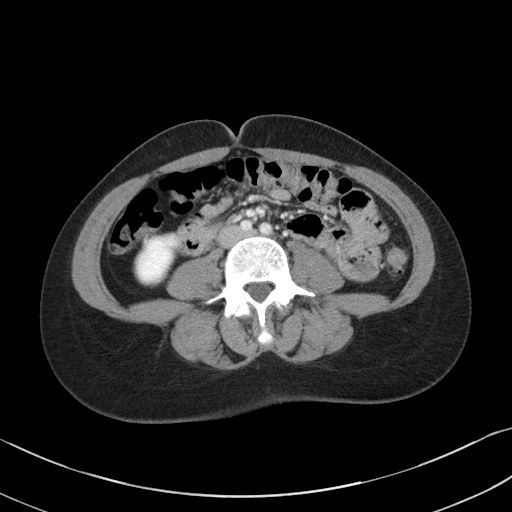
[im 50/89  soft-tissue]
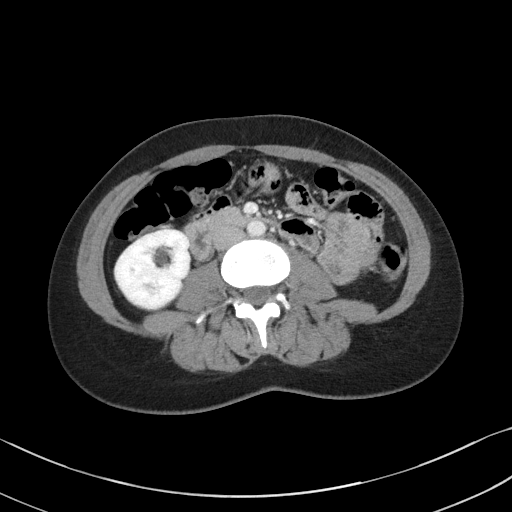
[im 57/89  soft-tissue]
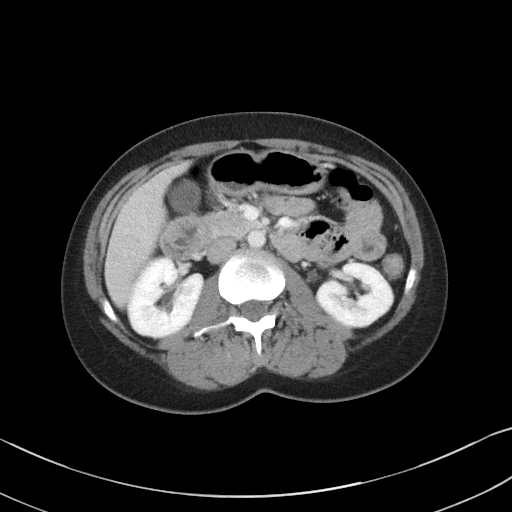
[im 57/89  bone]
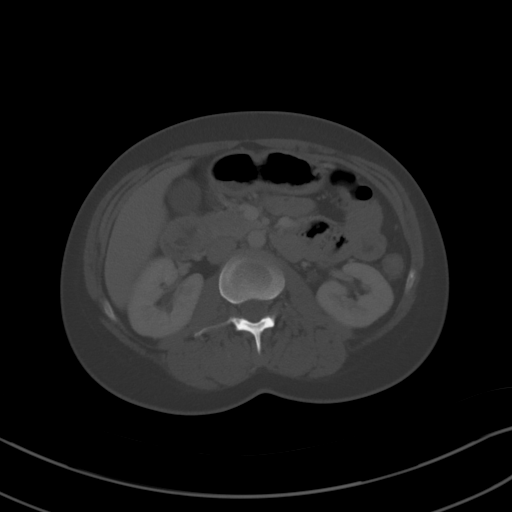
[im 64/89  soft-tissue]
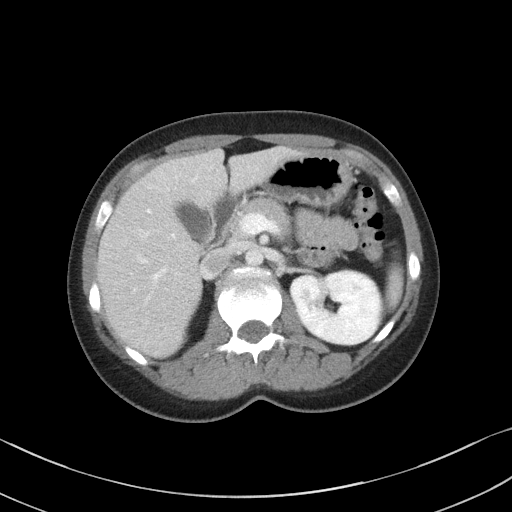
[im 71/89  soft-tissue]
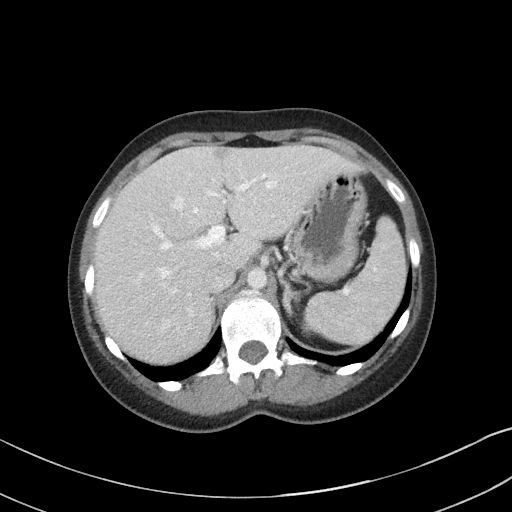
[im 78/89  soft-tissue]
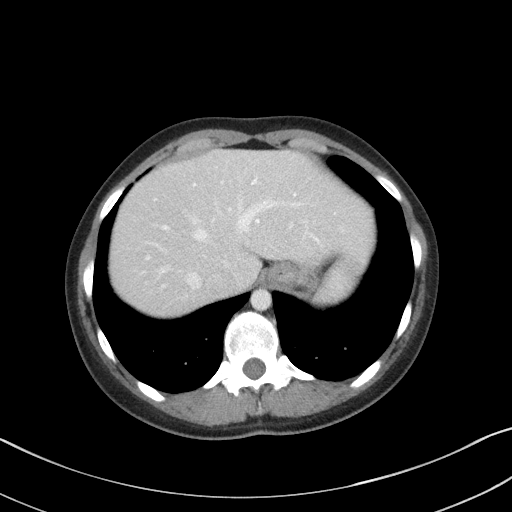
[im 85/89  soft-tissue]
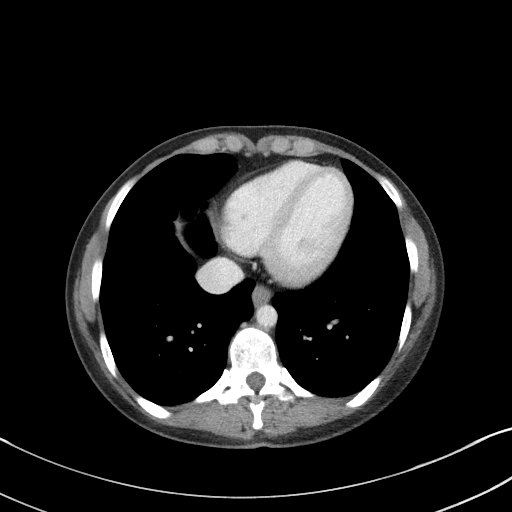

[Series 5: coronal st · coronal · 0.69mm/px · 3 of 75 slices shown]
[im 25/75  soft-tissue]
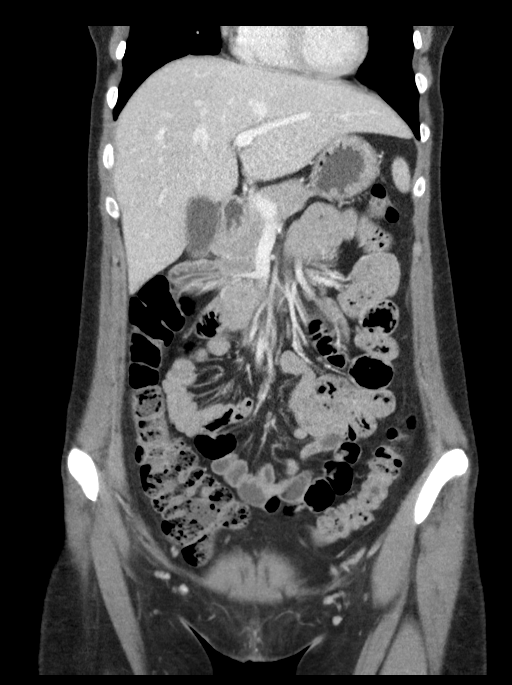
[im 33/75  soft-tissue]
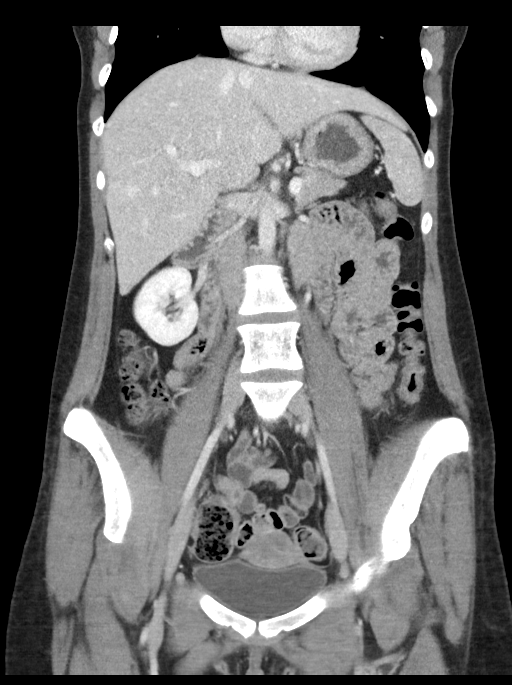
[im 42/75  soft-tissue]
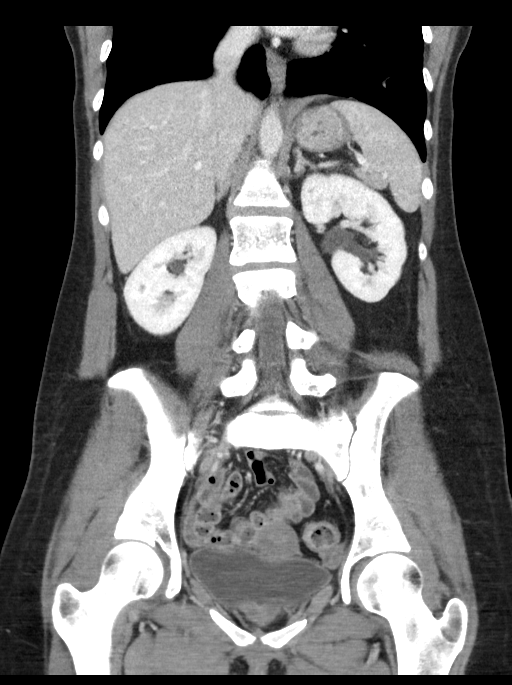

[16 of 46 positions shown; findings below may reference images not displayed]

FINDINGS: Lower chest: Mild dependent changes in the lung bases.

Hepatobiliary: No focal liver abnormality is seen. No gallstones,
gallbladder wall thickening, or biliary dilatation.

Pancreas: Unremarkable. No pancreatic ductal dilatation or
surrounding inflammatory changes.

Spleen: Normal in size without focal abnormality.

Adrenals/Urinary Tract: Adrenal glands are unremarkable. Kidneys are
normal, without renal calculi, focal lesion, or hydronephrosis.
Bladder is unremarkable.

Stomach/Bowel: Stomach, small bowel, and colon are not abnormally
distended. No wall thickening or inflammatory infiltration is
identified. Appendix is normal.

Vascular/Lymphatic: No significant vascular findings are present. No
enlarged abdominal or pelvic lymph nodes.

Reproductive: Uterus and bilateral adnexa are unremarkable. Small
amount of free fluid in the pelvis is likely to be physiologic.

Other: No free air in the abdomen. Abdominal wall musculature
appears intact.

Musculoskeletal: No acute or significant osseous findings.
IMPRESSION: 1. No acute process demonstrated in the abdomen or pelvis. No
evidence of bowel obstruction or inflammation.
2. Minimal free fluid in the pelvis is likely physiologic.

## 2019-05-05 ENCOUNTER — Encounter (HOSPITAL_BASED_OUTPATIENT_CLINIC_OR_DEPARTMENT_OTHER): Payer: Self-pay

## 2019-05-05 ENCOUNTER — Emergency Department (HOSPITAL_BASED_OUTPATIENT_CLINIC_OR_DEPARTMENT_OTHER): Payer: BC Managed Care – PPO

## 2019-05-05 ENCOUNTER — Emergency Department (HOSPITAL_BASED_OUTPATIENT_CLINIC_OR_DEPARTMENT_OTHER)
Admission: EM | Admit: 2019-05-05 | Discharge: 2019-05-05 | Disposition: A | Discharge status: Home / Self Care | Payer: BC Managed Care – PPO | Attending: Emergency Medicine | Admitting: Emergency Medicine

## 2019-05-05 ENCOUNTER — Other Ambulatory Visit: Payer: Self-pay

## 2019-05-05 DIAGNOSIS — R102 Pelvic and perineal pain: Secondary | ICD-10-CM | POA: Insufficient documentation

## 2019-05-05 DIAGNOSIS — Z79899 Other long term (current) drug therapy: Secondary | ICD-10-CM | POA: Diagnosis not present

## 2019-05-05 DIAGNOSIS — N939 Abnormal uterine and vaginal bleeding, unspecified: Secondary | ICD-10-CM | POA: Insufficient documentation

## 2019-05-05 DIAGNOSIS — F17200 Nicotine dependence, unspecified, uncomplicated: Secondary | ICD-10-CM | POA: Diagnosis not present

## 2019-05-05 DIAGNOSIS — R1030 Lower abdominal pain, unspecified: Secondary | ICD-10-CM | POA: Diagnosis not present

## 2019-05-05 LAB — URINALYSIS, ROUTINE W REFLEX MICROSCOPIC
Bilirubin Urine: NEGATIVE
Glucose, UA: NEGATIVE mg/dL
Ketones, ur: NEGATIVE mg/dL
Leukocytes,Ua: NEGATIVE
Nitrite: NEGATIVE
Protein, ur: NEGATIVE mg/dL
Specific Gravity, Urine: 1.02 (ref 1.005–1.030)
pH: 6.5 (ref 5.0–8.0)

## 2019-05-05 LAB — ABO/RH: ABO/RH(D): AB POS

## 2019-05-05 LAB — CBC WITH DIFFERENTIAL/PLATELET
Abs Immature Granulocytes: 0.01 10*3/uL (ref 0.00–0.07)
Basophils Absolute: 0 10*3/uL (ref 0.0–0.1)
Basophils Relative: 0 %
Eosinophils Absolute: 0.1 10*3/uL (ref 0.0–0.5)
Eosinophils Relative: 1 %
HCT: 40.7 % (ref 36.0–46.0)
Hemoglobin: 13.6 g/dL (ref 12.0–15.0)
Immature Granulocytes: 0 %
Lymphocytes Relative: 29 %
Lymphs Abs: 2.1 10*3/uL (ref 0.7–4.0)
MCH: 32.5 pg (ref 26.0–34.0)
MCHC: 33.4 g/dL (ref 30.0–36.0)
MCV: 97.1 fL (ref 80.0–100.0)
Monocytes Absolute: 0.7 10*3/uL (ref 0.1–1.0)
Monocytes Relative: 10 %
Neutro Abs: 4.2 10*3/uL (ref 1.7–7.7)
Neutrophils Relative %: 60 %
Platelets: 203 10*3/uL (ref 150–400)
RBC: 4.19 MIL/uL (ref 3.87–5.11)
RDW: 10.7 % — ABNORMAL LOW (ref 11.5–15.5)
WBC: 7.2 10*3/uL (ref 4.0–10.5)
nRBC: 0 % (ref 0.0–0.2)

## 2019-05-05 LAB — BASIC METABOLIC PANEL
Anion gap: 10 (ref 5–15)
BUN: 13 mg/dL (ref 6–20)
CO2: 24 mmol/L (ref 22–32)
Calcium: 9.1 mg/dL (ref 8.9–10.3)
Chloride: 106 mmol/L (ref 98–111)
Creatinine, Ser: 0.54 mg/dL (ref 0.44–1.00)
GFR calc Af Amer: >60 mL/min (ref >60)
GFR calc non Af Amer: >60 mL/min (ref >60)
Glucose, Bld: 89 mg/dL (ref 70–99)
Potassium: 3.8 mmol/L (ref 3.5–5.1)
Sodium: 140 mmol/L (ref 135–145)

## 2019-05-05 LAB — WET PREP, GENITAL
Sperm: NONE SEEN
Trich, Wet Prep: NONE SEEN
Yeast Wet Prep HPF POC: NONE SEEN

## 2019-05-05 LAB — URINALYSIS, MICROSCOPIC (REFLEX): WBC, UA: NONE SEEN WBC/hpf (ref 0–5)

## 2019-05-05 LAB — HCG, QUANTITATIVE, PREGNANCY: hCG, Beta Chain, Quant, S: <1 m[IU]/mL (ref <5)

## 2019-05-05 LAB — PREGNANCY, URINE: Preg Test, Ur: NEGATIVE

## 2019-05-05 NOTE — ED Provider Notes (Signed)
[facility] EMERGENCY DEPARTMENT Provider Note   CSN: [id number] Arrival date & time: 05/05/19  1849    History   Chief Complaint Chief Complaint  Patient presents with  . Vaginal Bleeding    HPI Carolyn Deleon is a 28 y.o. female.     Carolyn Deleon is a 28 y.o. female who is otherwise healthy, presents to the emergency department for evaluation of vaginal bleeding.  She reports she is concerned she may be pregnant, she has taken multiple home pregnancy test 3 of which were positive, and 3 of which were negative.  She is unsure of her last menstrual period because she has been on Depo-Provera injections but about 2 months ago she missed her injection and it was about a month and a half late.  She reports over the past 4 days she has had intermittent vaginal bleeding for the most part it is light and described as spotting but she has had a few instances of heavier "gushes of blood" and she is also noted some small pieces of tissue.  She reports some intermittent lower abdominal cramping.  No upper abdominal pain.  No fevers or chills, no nausea or vomiting.  No diarrhea, or constipation, no blood in the stools.  She has not had any dysuria, urinary frequency or hematuria.  She does report that she had unprotected sex multiple times when she missed her Depo-Provera injection.  She would also like STD testing, she reports that before the bleeding started she had some symptoms of possible yeast infection and used over-the-counter Monistat but denies any other vaginal discharge.     History reviewed. No pertinent past medical history.  There are no active problems to display for this patient.   Past Surgical History:  Procedure Laterality Date  . CYST EXCISION    . KIDNEY STONE SURGERY    . SHOULDER SURGERY    . TONSILLECTOMY       OB History    Gravida  0   Para  0   Term  0   Preterm  0   AB  0   Living  0     SAB  0   TAB  0   Ectopic  0   Multiple  0   Live Births  0            Home Medications    Prior to Admission medications   Medication Sig Start Date End Date Taking? Authorizing Provider  dicyclomine (BENTYL) 20 MG tablet Take 1 tablet (20 mg total) by mouth 3 (three) times daily before meals. As needed 02/01/18   Cristina GongHammond, Elizabeth W, PA-C  gabapentin (NEURONTIN) 100 MG capsule Take 100 mg by mouth 3 (three) times daily.    [provider]  medroxyPROGESTERone (DEPO-PROVERA) 150 MG/ML injection Inject 1 mL (150 mg total) into the muscle every 3 (three) months. 09/08/18   Levie HeritageStinson, Jacob J, DO  oxcarbazepine (TRILEPTAL) 600 MG tablet Take 600 mg by mouth 2 (two) times daily.    [provider]  pramipexole (MIRAPEX) 0.75 MG tablet Take 0.75 mg by mouth 3 (three) times daily.    [provider]  traZODone (DESYREL) 100 MG tablet Take 100 mg by mouth at bedtime.    [provider]    Family History Family History  Problem Relation Age of Onset  . Hypertension Father   . Heart disease Father   . Diabetes Father   . Hypertension Mother   . Hypothyroidism Mother  Social History Social History   Tobacco Use  . Smoking status: Current Every Day Smoker  . Smokeless tobacco: Never Used  Substance Use Topics  . Alcohol use: Not Currently  . Drug use: Not Currently    Comment: former opiate addiction     Allergies   Oxycodone   Review of Systems Review of Systems  Constitutional: Negative for chills and fever.  HENT: Negative.   Respiratory: Negative for cough and shortness of breath.   Cardiovascular: Negative for chest pain.  Gastrointestinal: Positive for abdominal pain. Negative for blood in stool, constipation, diarrhea, nausea and vomiting.  Genitourinary: Positive for pelvic pain and vaginal bleeding. Negative for dysuria, flank pain, frequency, hematuria and vaginal discharge.  Musculoskeletal: Negative for arthralgias and myalgias.  Skin: Negative for color change and  rash.  Neurological: Negative for dizziness, syncope and light-headedness.  All other systems reviewed and are negative.    Physical Exam Updated Vital Signs BP (!) 127/99 (BP Location: Left Arm)   Pulse 92   Temp 99.1 F (37.3 C) (Oral)   Resp 16   Ht 5\' 8"  (1.727 m)   Wt 60.8 kg   SpO2 100%   BMI 20.37 kg/m   Physical Exam Vitals signs and nursing note reviewed. Exam conducted with a chaperone present.  Constitutional:      General: She is not in acute distress.    Appearance: Normal appearance. She is well-developed and normal weight. She is not ill-appearing or diaphoretic.  HENT:     Head: Normocephalic and atraumatic.     Mouth/Throat:     Mouth: Mucous membranes are moist.     Pharynx: Oropharynx is clear.  Eyes:     General:        Right eye: No discharge.        Left eye: No discharge.     Pupils: Pupils are equal, round, and reactive to light.  Neck:     Musculoskeletal: Neck supple.  Cardiovascular:     Rate and Rhythm: Normal rate and regular rhythm.     Heart sounds: Normal heart sounds. No murmur. No friction rub. No gallop.   Pulmonary:     Effort: Pulmonary effort is normal. No respiratory distress.     Breath sounds: Normal breath sounds. No wheezing or rales.     Comments: Respirations equal and unlabored, patient able to speak in full sentences, lungs clear to auscultation bilaterally Abdominal:     General: Bowel sounds are normal. There is no distension.     Palpations: Abdomen is soft. There is no mass.     Tenderness: There is abdominal tenderness. There is no guarding.     Comments: Abdomen soft, nondistended, bowel sounds present throughout, there is some mild tenderness in the lower abdomen pelvic region but no guarding or rebound tenderness, tenderness does not localize to one side.  Genitourinary:    Comments: Chaperone present during pelvic exam. No external genital lesions noted. Speculum exam with small amount of bleeding, no tissue or  clots noted, no vaginal discharge. Bimanual exam without cervical motion tenderness or focal adnexal tenderness or masses. Musculoskeletal:        General: No deformity.  Skin:    General: Skin is warm and dry.     Capillary Refill: Capillary refill takes less than 2 seconds.  Neurological:     Mental Status: She is alert and oriented to person, place, and time.     Coordination: Coordination normal.  Comments: Speech is clear, able to follow commands Moves extremities without ataxia, coordination intact  Psychiatric:        Mood and Affect: Mood normal.        Behavior: Behavior normal.      ED Treatments / Results  Labs (all labs ordered are listed, but only abnormal results are displayed) Labs Reviewed  WET PREP, GENITAL - Abnormal; Notable for the following components:      Result Value   Clue Cells Wet Prep HPF POC PRESENT (*)    WBC, Wet Prep HPF POC FEW (*)    All other components within normal limits  URINALYSIS, ROUTINE W REFLEX MICROSCOPIC - Abnormal; Notable for the following components:   Hgb urine dipstick TRACE (*)    All other components within normal limits  CBC WITH DIFFERENTIAL/PLATELET - Abnormal; Notable for the following components:   RDW 10.7 (*)    All other components within normal limits  URINALYSIS, MICROSCOPIC (REFLEX) - Abnormal; Notable for the following components:   Bacteria, UA RARE (*)    All other components within normal limits  PREGNANCY, URINE  BASIC METABOLIC PANEL  HCG, QUANTITATIVE, PREGNANCY  RPR  HIV ANTIBODY (ROUTINE TESTING W REFLEX)  ABO/RH  GC/CHLAMYDIA PROBE AMP ([place]) NOT AT [facility]    EKG None  Radiology [place] Pelvic Complete With Transvaginal  Result Date: 05/05/2019 CLINICAL DATA:  Vaginal bleeding for 4 days. EXAM: TRANSABDOMINAL AND TRANSVAGINAL ULTRASOUND OF PELVIS TECHNIQUE: Both transabdominal and transvaginal ultrasound examinations of the pelvis were performed. Transabdominal technique was performed for  global imaging of the pelvis including uterus, ovaries, adnexal regions, and pelvic cul-de-sac. It was necessary to proceed with endovaginal exam following the transabdominal exam to visualize the endometrium and right ovary. COMPARISON:  No prior ultrasound.  CT 02/01/2018 and 07/21/2018 FINDINGS: Uterus Measurements: 6.0 x 3.3 x 4.1 cm = volume: 42 mL. No fibroids or other mass visualized. Endometrium Thickness: 5 mm, normal.  No focal abnormality visualized. Right ovary Measurements: 2.7 x 1.0 x 1.5 cm = volume: 2.2 mL. Normal appearance, blood flow noted. No adnexal mass. Left ovary Measurements: 2.4 x 1.5 x 1.5 cm = volume: 2.9 mL. Normal appearance with physiologic follicles. Blood flow noted. No adnexal mass. Other findings No abnormal free fluid. IMPRESSION: Normal pelvic ultrasound. Electronically Signed   By: Narda RutherfordMelanie  Sanford M.D.   On: 05/05/2019 21:15    Procedures Procedures (including critical care time)  Medications Ordered in ED Medications - No data to display   Initial Impression / Assessment and Plan / ED Course  I have reviewed the triage vital signs and the nursing notes.  Pertinent labs & imaging results that were available during my care of the patient were reviewed by me and considered in my medical decision making (see chart for details).  Presents with vaginal bleeding and lower abdominal pain with concern for possible pregnancy, she has had some positive and negative home pregnancy test.  4 days of intermittent vaginal bleeding and lower abdominal discomfort.  Missed a Depo-Provera injection about 2 months ago and has had unprotected sex.  On exam she has some mild tenderness in the lower abdomen but no guarding or peritoneal signs.  She has stable vitals and is overall well-appearing.  Pelvic exam with slight bleeding, no discharge or cervical motion tenderness to suggest PID, no focal adnexal masses.  But given home pregnancy test that were positive.  Will check urine and  blood pregnancy tests and get pelvic ultrasound to  rule out any ectopic pregnancy or other pelvic abnormality.  We will also check basic labs, STD testing and ABO blood type.  Labs overall reassuring, no leukocytosis and normal hemoglobin, no acute electrolyte derangements, normal renal function, urine pregnancy is negative and quantitative hCG is less than 1.  Urinalysis without signs of infection.  Wet prep with few WBCs and clue cells present but no discharge, do not feel that patient needs treatment for with Flagyl given that she is asymptomatic.  STD testing pending.  Ultrasound unremarkable no evidence of intrauterine pregnancy or ectopic pregnancy, no ovarian cysts or other abnormalities noted.  Discussed results with patient, suspect the bleeding and pain that she has had is likely a light menstrual cycle.  Will have patient follow-up with OB/GYN and PCP.  Return precautions discussed.  Patient expresses understanding and agreement with plan.  Discharged home in good condition.  Final Clinical Impressions(s) / ED Diagnoses   Final diagnoses:  Vaginal bleeding  Lower abdominal pain    ED Discharge Orders    None       Dartha LodgeFord, Gabriellia Rempel N, Cordelia Poche-C 05/05/19 2153    Virgina Norfolkuratolo, Adam, DO 05/05/19 2309

## 2019-05-05 NOTE — Discharge Instructions (Signed)
Please follow-up with your regular doctor or OB/GYN.  Your evaluation today is reassuring, your pregnancy test is negative and your ultrasound looks normal.  The bleeding you are having may have been a light menstrual cycle.  Continue with your Depo-Provera injections as directed, if you would like to use a different form of birth control please follow-up with your OB/GYN.

## 2019-05-05 NOTE — ED Triage Notes (Signed)
C/o vaginal bleeding x 4 days-states she had pos home preg test-no medical care-unsure of LMP due to she was on depo-missed does and states she injects herself so no preg test done prior to last injection ~1 month ago-NAD-steady gait

## 2019-05-05 NOTE — ED Notes (Signed)
Patient transported to Ultrasound 

## 2019-05-06 LAB — GC/CHLAMYDIA PROBE AMP (~~LOC~~) NOT AT ARMC
Chlamydia: NEGATIVE
Neisseria Gonorrhea: NEGATIVE

## 2019-05-07 LAB — HIV ANTIBODY (ROUTINE TESTING W REFLEX): HIV Screen 4th Generation wRfx: NONREACTIVE

## 2019-05-07 LAB — RPR: RPR Ser Ql: NONREACTIVE

## 2019-10-19 ENCOUNTER — Other Ambulatory Visit: Payer: Self-pay

## 2019-10-19 MED ORDER — MEDROXYPROGESTERONE ACETATE 150 MG/ML IM SUSP: 150.0000 mg | INTRAMUSCULAR | 0 refills | Status: DC

## 2020-01-16 ENCOUNTER — Other Ambulatory Visit: Payer: Self-pay | Admitting: Family Medicine

## 2020-04-18 ENCOUNTER — Other Ambulatory Visit: Payer: Self-pay | Admitting: Family Medicine

## 2022-03-06 ENCOUNTER — Other Ambulatory Visit (HOSPITAL_COMMUNITY): Payer: Self-pay | Admitting: Otolaryngology

## 2022-03-06 ENCOUNTER — Other Ambulatory Visit: Payer: Self-pay | Admitting: Otolaryngology

## 2022-03-06 DIAGNOSIS — M25512 Pain in left shoulder: Secondary | ICD-10-CM

## 2022-03-08 ENCOUNTER — Ambulatory Visit (HOSPITAL_COMMUNITY)
Admission: RE | Admit: 2022-03-08 | Discharge: 2022-03-08 | Disposition: A | Discharge status: Home / Self Care | Payer: BC Managed Care – PPO | Source: Ambulatory Visit | Attending: Otolaryngology | Admitting: Otolaryngology

## 2022-03-08 DIAGNOSIS — M25512 Pain in left shoulder: Secondary | ICD-10-CM | POA: Diagnosis present

## 2022-08-31 ENCOUNTER — Encounter (HOSPITAL_BASED_OUTPATIENT_CLINIC_OR_DEPARTMENT_OTHER): Payer: Self-pay

## 2022-08-31 ENCOUNTER — Other Ambulatory Visit: Payer: Self-pay

## 2022-08-31 ENCOUNTER — Emergency Department (HOSPITAL_BASED_OUTPATIENT_CLINIC_OR_DEPARTMENT_OTHER): Payer: BC Managed Care – PPO

## 2022-08-31 ENCOUNTER — Emergency Department (HOSPITAL_BASED_OUTPATIENT_CLINIC_OR_DEPARTMENT_OTHER)
Admission: EM | Admit: 2022-08-31 | Discharge: 2022-08-31 | Disposition: A | Discharge status: Home / Self Care | Payer: BC Managed Care – PPO | Attending: Emergency Medicine | Admitting: Emergency Medicine

## 2022-08-31 DIAGNOSIS — R110 Nausea: Secondary | ICD-10-CM | POA: Insufficient documentation

## 2022-08-31 DIAGNOSIS — R42 Dizziness and giddiness: Secondary | ICD-10-CM | POA: Insufficient documentation

## 2022-08-31 DIAGNOSIS — F17210 Nicotine dependence, cigarettes, uncomplicated: Secondary | ICD-10-CM | POA: Diagnosis not present

## 2022-08-31 DIAGNOSIS — R0789 Other chest pain: Secondary | ICD-10-CM | POA: Diagnosis present

## 2022-08-31 DIAGNOSIS — R079 Chest pain, unspecified: Secondary | ICD-10-CM

## 2022-08-31 LAB — BASIC METABOLIC PANEL
Anion gap: 5 (ref 5–15)
BUN: 11 mg/dL (ref 6–20)
CO2: 27 mmol/L (ref 22–32)
Calcium: 8.7 mg/dL — ABNORMAL LOW (ref 8.9–10.3)
Chloride: 103 mmol/L (ref 98–111)
Creatinine, Ser: 0.61 mg/dL (ref 0.44–1.00)
GFR, Estimated: >60 mL/min (ref >60)
Glucose, Bld: 94 mg/dL (ref 70–99)
Potassium: 4.6 mmol/L (ref 3.5–5.1)
Sodium: 135 mmol/L (ref 135–145)

## 2022-08-31 LAB — CBC
HCT: 38.6 % (ref 36.0–46.0)
Hemoglobin: 13.3 g/dL (ref 12.0–15.0)
MCH: 34.2 pg — ABNORMAL HIGH (ref 26.0–34.0)
MCHC: 34.5 g/dL (ref 30.0–36.0)
MCV: 99.2 fL (ref 80.0–100.0)
Platelets: 217 10*3/uL (ref 150–400)
RBC: 3.89 MIL/uL (ref 3.87–5.11)
RDW: 11.8 % (ref 11.5–15.5)
WBC: 7.3 10*3/uL (ref 4.0–10.5)
nRBC: 0 % (ref 0.0–0.2)

## 2022-08-31 LAB — TROPONIN I (HIGH SENSITIVITY)
Troponin I (High Sensitivity): <2 ng/L (ref <18)
Troponin I (High Sensitivity): <2 ng/L (ref <18)

## 2022-08-31 LAB — HCG, SERUM, QUALITATIVE: Preg, Serum: NEGATIVE

## 2022-08-31 MED ORDER — ACETAMINOPHEN 325 MG PO TABS
650.0000 mg | ORAL_TABLET | Freq: Once | ORAL | Status: AC
  Administered 2022-08-31: 650 mg via ORAL
  Filled 2022-08-31: qty 2

## 2022-08-31 NOTE — ED Triage Notes (Signed)
Pt reports random chest pain for "awhile" Now reports one hour ago pain radiating up into jaw , arm and behind ear. Pain left chest as well. Pt reports getting ready for work prior to chest pain starting

## 2022-08-31 NOTE — ED Provider Notes (Signed)
[facility] EMERGENCY DEPARTMENT Provider Note   CSN: [id number] Arrival date & time: 08/31/22  2423     History  Chief Complaint  Patient presents with   Chest Pain    Carolyn Deleon is a 31 y.o. female.  Patient is a 31 year old female with a past medical history of psoriatic arthritis and bipolar disorder presenting to the emergency department with chest pain.  She states that the pain radiates down her left arm and into her left side of her neck.  Patient states for the last few months she has had an on and off sharp left-sided chest pain.  She states that normally the pain only lasts 5 to 10 minutes at a time.  She states however this morning the chest pain started while she was getting ready for work and has been constant since.  She states the pain started about an hour ago.  She denies any associated shortness of breath but reports associated lightheadedness and dizziness with nausea.  She denies any vomiting.  She denies any lower extremity swelling.  She denies any recent fevers, cough or congestion.  She denies any history of blood clots, recent hospitalizations or surgeries, recent long travels in the car plane, blood thinner use or hormone use.  She states that she does have a family history of cardiac disease in her father and grandfather but are unsure at what age they were diagnosed.  She does report smoking a pack of cigarettes per day.  He states that she took Tums and Protonix this morning without any relief.  The history is provided by the patient and the spouse.  Chest Pain      Home Medications Prior to Admission medications   Medication Sig Start Date End Date Taking? Authorizing Provider  dicyclomine (BENTYL) 20 MG tablet Take 1 tablet (20 mg total) by mouth 3 (three) times daily before meals. As needed 02/01/18   Lorin Glass, PA-C  gabapentin (NEURONTIN) 100 MG capsule Take 100 mg by mouth 3 (three) times daily.    [provider]   medroxyPROGESTERone (DEPO-PROVERA) 150 MG/ML injection INJECT 1 ML (150 MG TOTAL) INTO THE MUSCLE EVERY 3 (THREE) MONTHS. 01/18/20   Truett Mainland, DO  oxcarbazepine (TRILEPTAL) 600 MG tablet Take 600 mg by mouth 2 (two) times daily.    [provider]  pramipexole (MIRAPEX) 0.75 MG tablet Take 0.75 mg by mouth 3 (three) times daily.    [provider]  traZODone (DESYREL) 100 MG tablet Take 100 mg by mouth at bedtime.    [provider]      Allergies    Oxycodone    Review of Systems   Review of Systems  Cardiovascular:  Positive for chest pain.    Physical Exam Updated Vital Signs BP 107/71   Pulse 67   Temp 97.9 F (36.6 C) (Oral)   Resp 12   Ht 5\' 8"  (1.727 m)   Wt 68 kg   LMP 08/17/2022   SpO2 100%   BMI 22.81 kg/m  Physical Exam Vitals and nursing note reviewed.  Constitutional:      General: She is not in acute distress.    Appearance: She is well-developed.  HENT:     Head: Normocephalic and atraumatic.  Eyes:     Extraocular Movements: Extraocular movements intact.  Cardiovascular:     Rate and Rhythm: Normal rate and regular rhythm.     Pulses:  Radial pulses are 2+ on the right side and 2+ on the left side.       Dorsalis pedis pulses are 2+ on the right side and 2+ on the left side.     Heart sounds: Normal heart sounds.     Comments: Left sided chest wall tenderness to palpation without overlying skin changes Pulmonary:     Effort: Pulmonary effort is normal.     Breath sounds: Normal breath sounds.  Chest:     Chest wall: Tenderness (Left-sided chest wall) present.  Abdominal:     Palpations: Abdomen is soft.     Tenderness: There is no abdominal tenderness.  Musculoskeletal:        General: Normal range of motion.     Cervical back: Normal range of motion and neck supple.     Right lower leg: No edema.     Left lower leg: No edema.  Skin:    General: Skin is warm.  Neurological:     General: No focal  deficit present.     Mental Status: She is alert and oriented to person, place, and time.     Motor: No weakness.  Psychiatric:        Mood and Affect: Mood normal.        Behavior: Behavior normal.     ED Results / Procedures / Treatments   Labs (all labs ordered are listed, but only abnormal results are displayed) Labs Reviewed  BASIC METABOLIC PANEL - Abnormal; Notable for the following components:      Result Value   Calcium 8.7 (*)    All other components within normal limits  CBC - Abnormal; Notable for the following components:   MCH 34.2 (*)    All other components within normal limits  HCG, SERUM, QUALITATIVE  TROPONIN I (HIGH SENSITIVITY)  TROPONIN I (HIGH SENSITIVITY)    EKG EKG Interpretation  Date/Time:  Friday August 31 2022 10:47:13 EDT Ventricular Rate:  69 PR Interval:  219 QRS Duration: 96 QT Interval:  399 QTC Calculation: 428 R Axis:   45 Text Interpretation: Sinus rhythm Prolonged PR interval Anteroseptal infarct, old No significant change since last tracing today Confirmed by Elayne Snare ([id number]) on 08/31/2022 11:25:23 AM  Radiology DG Chest 2 View  Result Date: 08/31/2022 CLINICAL DATA:  Chest pain EXAM: CHEST - 2 VIEW COMPARISON:  No comparison studies available. FINDINGS: The lungs are clear without focal pneumonia, edema, pneumothorax or pleural effusion. The cardiopericardial silhouette is within normal limits for size. The visualized bony structures of the thorax are unremarkable. Telemetry leads overlie the chest. IMPRESSION: No active cardiopulmonary disease. Electronically Signed   By: Kennith Center M.D.   On: 08/31/2022 09:10    Procedures Procedures    Medications Ordered in ED Medications  acetaminophen (TYLENOL) tablet 650 mg (650 mg Oral Given 08/31/22 0917)    ED Course/ Medical Decision Making/ A&P Clinical Course as of 08/31/22 1142  Fri Aug 31, 2022  1124 Repeat troponin is negative and EKG is unchanged without ischemic  changes. [VK]  1140 On reassessment, patient's chest pain has improved.  She is stable for discharge with primary care follow-up and was given strict return precautions. [VK]    Clinical Course User Index [VK] Rexford Maus, DO                           Medical Decision Making This patient presents to the ED with chief  complaint(s) of chest pain with pertinent past medical history of psoriatic arthritis and bipolar disorder which further complicates the presenting complaint. The complaint involves an extensive differential diagnosis and also carries with it a high risk of complications and morbidity.    The differential diagnosis includes ACS, arrhythmia, anemia, pneumothorax, pneumonia, pleural effusion, pulmonary edema, gastritis, GERD, dissection unlikely as patient has equal pulses in all 4 extremities is well-appearing and has no focal neurologic deficits, PE unlikely as patient is PERC negative  Additional history obtained: Additional history obtained from significant other Records reviewed Care Everywhere/External Records  ED Course and Reassessment: Patient was initially evaluated by triage and had EKG, labs and chest x-ray performed.  EKG is nonischemic appearing without any priors for comparison.  Remainder of her labs are pending at this time.  Is her pain short started shortly prior to arrival she will require 2 troponin evaluation.  She will be given Tylenol for her pain with her associated chest wall tenderness and will be reassessed.  Independent labs interpretation:  The following labs were independently interpreted: Within normal range  Independent visualization of imaging: - I independently visualized the following imaging with scope of interpretation limited to determining acute life threatening conditions related to emergency care: Chest x-ray, which revealed no acute disease  Consultation: - Consulted or discussed management/test interpretation w/ external  professional: N/A  Consideration for admission or further workup: Patient has no emergent conditions requiring admission or further work-up at this time and is stable for discharge with primary care follow-up Social Determinants of health: N/A    Amount and/or Complexity of Data Reviewed Labs: ordered. Radiology: ordered.  Risk OTC drugs.          Final Clinical Impression(s) / ED Diagnoses Final diagnoses:  Nonspecific chest pain    Rx / DC Orders ED Discharge Orders     None         Rexford Maus, DO 08/31/22 1142

## 2022-08-31 NOTE — Discharge Instructions (Signed)
You were seen in the emergency department for your chest pain.  Your work-up showed no signs of heart attack or stress on your heart and your lungs were cleared fully inflated without any fluid on your lungs.  It is unclear what is causing your chest pain at this time but you can continue to take Tylenol and Motrin as needed.  You should follow-up with your primary doctor to have your symptoms rechecked.  You should return to the emergency department if your pain gets significantly worse, you have severe shortness of breath, you pass out or if you have any other new or concerning symptoms.

## 2023-05-03 ENCOUNTER — Emergency Department (HOSPITAL_BASED_OUTPATIENT_CLINIC_OR_DEPARTMENT_OTHER)
Admission: EM | Admit: 2023-05-03 | Discharge: 2023-05-03 | Disposition: A | Discharge status: Home / Self Care | Payer: BC Managed Care – PPO | Source: Home / Self Care | Attending: Emergency Medicine | Admitting: Emergency Medicine

## 2023-05-03 ENCOUNTER — Encounter (HOSPITAL_BASED_OUTPATIENT_CLINIC_OR_DEPARTMENT_OTHER): Payer: Self-pay

## 2023-05-03 ENCOUNTER — Other Ambulatory Visit: Payer: Self-pay

## 2023-05-03 ENCOUNTER — Emergency Department (HOSPITAL_BASED_OUTPATIENT_CLINIC_OR_DEPARTMENT_OTHER): Payer: BC Managed Care – PPO

## 2023-05-03 DIAGNOSIS — N12 Tubulo-interstitial nephritis, not specified as acute or chronic: Secondary | ICD-10-CM | POA: Insufficient documentation

## 2023-05-03 DIAGNOSIS — R109 Unspecified abdominal pain: Secondary | ICD-10-CM | POA: Diagnosis present

## 2023-05-03 LAB — COMPREHENSIVE METABOLIC PANEL
ALT: 10 U/L (ref 0–44)
AST: 15 U/L (ref 15–41)
Albumin: 3.7 g/dL (ref 3.5–5.0)
Alkaline Phosphatase: 78 U/L (ref 38–126)
Anion gap: 7 (ref 5–15)
BUN: 10 mg/dL (ref 6–20)
CO2: 25 mmol/L (ref 22–32)
Calcium: 8.6 mg/dL — ABNORMAL LOW (ref 8.9–10.3)
Chloride: 100 mmol/L (ref 98–111)
Creatinine, Ser: 0.63 mg/dL (ref 0.44–1.00)
GFR, Estimated: >60 mL/min (ref >60)
Glucose, Bld: 90 mg/dL (ref 70–99)
Potassium: 4 mmol/L (ref 3.5–5.1)
Sodium: 132 mmol/L — ABNORMAL LOW (ref 135–145)
Total Bilirubin: 0.5 mg/dL (ref 0.3–1.2)
Total Protein: 6.7 g/dL (ref 6.5–8.1)

## 2023-05-03 LAB — CBC
HCT: 37.1 % (ref 36.0–46.0)
Hemoglobin: 12.9 g/dL (ref 12.0–15.0)
MCH: 34.2 pg — ABNORMAL HIGH (ref 26.0–34.0)
MCHC: 34.8 g/dL (ref 30.0–36.0)
MCV: 98.4 fL (ref 80.0–100.0)
Platelets: 245 10*3/uL (ref 150–400)
RBC: 3.77 MIL/uL — ABNORMAL LOW (ref 3.87–5.11)
RDW: 12.6 % (ref 11.5–15.5)
WBC: 7.8 10*3/uL (ref 4.0–10.5)
nRBC: 0 % (ref 0.0–0.2)

## 2023-05-03 LAB — URINALYSIS, ROUTINE W REFLEX MICROSCOPIC
Bilirubin Urine: NEGATIVE
Glucose, UA: NEGATIVE mg/dL
Ketones, ur: NEGATIVE mg/dL
Leukocytes,Ua: NEGATIVE
Nitrite: NEGATIVE
Protein, ur: 30 mg/dL — AB
Specific Gravity, Urine: >=1.03 (ref 1.005–1.030)
pH: 6 (ref 5.0–8.0)

## 2023-05-03 LAB — PREGNANCY, URINE: Preg Test, Ur: NEGATIVE

## 2023-05-03 LAB — URINALYSIS, MICROSCOPIC (REFLEX)

## 2023-05-03 LAB — CK: Total CK: 69 U/L (ref 38–234)

## 2023-05-03 LAB — LIPASE, BLOOD: Lipase: 75 U/L — ABNORMAL HIGH (ref 11–51)

## 2023-05-03 MED ORDER — FENTANYL CITRATE PF 50 MCG/ML IJ SOSY
50.0000 ug | PREFILLED_SYRINGE | Freq: Once | INTRAMUSCULAR | Status: AC
  Administered 2023-05-03: 50 ug via INTRAVENOUS
  Filled 2023-05-03: qty 1

## 2023-05-03 MED ORDER — SODIUM CHLORIDE 0.9 % IV SOLN
1.0000 g | Freq: Once | INTRAVENOUS | Status: AC
  Administered 2023-05-03: 1 g via INTRAVENOUS
  Filled 2023-05-03: qty 10

## 2023-05-03 MED ORDER — ONDANSETRON HCL 4 MG/2ML IJ SOLN
4.0000 mg | Freq: Once | INTRAMUSCULAR | Status: AC
  Administered 2023-05-03: 4 mg via INTRAVENOUS
  Filled 2023-05-03: qty 2

## 2023-05-03 MED ORDER — CEFADROXIL 1 G PO TABS: 1.0000 g | ORAL_TABLET | Freq: Two times a day (BID) | ORAL | 0 refills | Status: AC

## 2023-05-03 MED ORDER — ONDANSETRON 4 MG PO TBDP: ORAL_TABLET | ORAL | 0 refills | Status: DC

## 2023-05-03 MED ORDER — IOHEXOL 300 MG/ML  SOLN
80.0000 mL | Freq: Once | INTRAMUSCULAR | Status: AC | PRN
  Administered 2023-05-03: 80 mL via INTRAVENOUS

## 2023-05-03 MED ORDER — SODIUM CHLORIDE 0.9 % IV BOLUS
1000.0000 mL | Freq: Once | INTRAVENOUS | Status: AC
  Administered 2023-05-03: 1000 mL via INTRAVENOUS

## 2023-05-03 MED ORDER — SODIUM CHLORIDE 0.9 % IV SOLN: INTRAVENOUS | Status: DC | PRN

## 2023-05-03 MED ORDER — SODIUM CHLORIDE 0.9 % IV SOLN: INTRAVENOUS | Status: DC

## 2023-05-03 MED ORDER — HYDROCODONE-ACETAMINOPHEN 5-325 MG PO TABS: 1.0000 | ORAL_TABLET | ORAL | 0 refills | Status: AC | PRN

## 2023-05-03 NOTE — ED Notes (Signed)
120mL/hr NS paused for ABX infusion with carrier fluid at 31mL/hr

## 2023-05-03 NOTE — Discharge Instructions (Addendum)
Contact a health care provider if: Your symptoms do not get better after 2 days of treatment. Your symptoms get worse. You have a fever or chills. You cannot take your antibiotics. Get help right away if: You vomit each time that you eat or drink. You have severe pain in your back or side. You are very weak, or you faint.

## 2023-05-03 NOTE — ED Triage Notes (Signed)
Pt reports to the ED with complaints of a possible kidney infection. States the pain has been going on for about a week. Reports some nausea in the am.

## 2023-05-03 NOTE — ED Notes (Signed)
Pt denies any burning with urination.

## 2023-05-03 NOTE — ED Provider Notes (Signed)
[place] [facility] Provider Note   CSN: [id number] Arrival date & time: 05/03/23  1017     History  Chief Complaint  Patient presents with   Flank Pain    Carolyn Deleon is a 32 y.o. female who presents with flank pain, bl. Onser x days. Hx of recurrent pyelonephritis. No known structural issues with renal system. Dark urine yesterday. No fever or chills. + nausea. Hx of kidney stones, but states this is  different. Patient works in high heat. Has felt weak and tired. Denies myalgia. No meds for pain.  Reviewed outside records.  Patient apparently had was hospitalized back in February 2024 for pyelonephritis.  Patient reports it was "due to a staph infection."    Flank Pain       Home Medications Prior to Admission medications   Medication Sig Start Date End Date Taking? Authorizing Provider  dicyclomine (BENTYL) 20 MG tablet Take 1 tablet (20 mg total) by mouth 3 (three) times daily before meals. As needed 02/01/18   Cristina Gong, PA-C  gabapentin (NEURONTIN) 100 MG capsule Take 100 mg by mouth 3 (three) times daily.    [provider]  medroxyPROGESTERone (DEPO-PROVERA) 150 MG/ML injection INJECT 1 ML (150 MG TOTAL) INTO THE MUSCLE EVERY 3 (THREE) MONTHS. 01/18/20   Levie Heritage, DO  oxcarbazepine (TRILEPTAL) 600 MG tablet Take 600 mg by mouth 2 (two) times daily.    [provider]  pramipexole (MIRAPEX) 0.75 MG tablet Take 0.75 mg by mouth 3 (three) times daily.    [provider]  traZODone (DESYREL) 100 MG tablet Take 100 mg by mouth at bedtime.    [provider]      Allergies    Oxycodone    Review of Systems   Review of Systems  Genitourinary:  Positive for flank pain.    Physical Exam Updated Vital Signs BP (!) 143/76 (BP Location: Right Arm)   Pulse 85   Temp 98.2 F (36.8 C) (Oral)   Resp 18   Ht 5\' 8"  (1.727 m)   Wt 65.8 kg   LMP 04/12/2023   SpO2 98%   BMI 22.05 kg/m   Physical Exam Vitals and nursing note reviewed.  Constitutional:      General: She is not in acute distress.    Appearance: She is well-developed. She is not diaphoretic.  HENT:     Head: Normocephalic and atraumatic.     Right Ear: External ear normal.     Left Ear: External ear normal.     Nose: Nose normal.     Mouth/Throat:     Mouth: Mucous membranes are moist.  Eyes:     General: No scleral icterus.    Conjunctiva/sclera: Conjunctivae normal.  Cardiovascular:     Rate and Rhythm: Normal rate and regular rhythm.     Heart sounds: Normal heart sounds. No murmur heard.    No friction rub. No gallop.  Pulmonary:     Effort: Pulmonary effort is normal. No respiratory distress.     Breath sounds: Normal breath sounds.  Abdominal:     General: Bowel sounds are normal. There is no distension.     Palpations: Abdomen is soft. There is no mass.     Tenderness: There is abdominal tenderness. There is right CVA tenderness, left CVA tenderness and guarding.     Comments: Then realized abdominal tenderness and voluntary guarding  Musculoskeletal:     Cervical back: Normal  range of motion.  Skin:    General: Skin is warm and dry.     Coloration: Skin is not jaundiced.  Neurological:     Mental Status: She is alert and oriented to person, place, and time.  Psychiatric:        Behavior: Behavior normal.     ED Results / Procedures / Treatments   Labs (all labs ordered are listed, but only abnormal results are displayed) Labs Reviewed - No data to display  EKG None  Radiology No results found.  Procedures Procedures    Medications Ordered in ED Medications - No data to display  ED Course/ Medical Decision Making/ A&P Clinical Course as of 05/03/23 1829  Fri May 03, 2023  1145 Bacteria, UA(!): FEW [AH]  1145 RBC / HPF: 21-50 [AH]  1145 Hgb urine dipstickMarland Kitchen): MODERATE [AH]  1145 Lipase(!): 75 [AH]  1145 WBC: 7.8 [AH]  1145 Sodium(!): 132 [AH]  1403 Urinalysis,  Routine w reflex microscopic -Urine, Clean Catch(!) [AH]  1403 Urinalysis, Microscopic (reflex)(!) [AH]  1403 CT ABDOMEN PELVIS W CONTRAST I personally visualized and interpreted the images using our PACS system. Acute findings include:  Inflammatory changes around the left kidney suggestive of pyelonephritis  [AH]    Clinical Course User Index [AH] Arthor Captain, PA-C                             Medical Decision Making 32 year old female who presents to the emergency department with chief complaint of flank pain. The differential diagnosis of emergent flank pain includes, but is not limited to :Abdominal aortic aneurysm,, Renal artery embolism,Renal vein thrombosis, Aortic dissection, Mesenteric ischemia, Pyelonephritis, Renal infarction, Renal hemorrhage, Nephrolithiasis/ Renal Colic, Bladder tumor,Cystitis, Biliary colic, Pancreatitis Perforated peptic ulcer Appendicitis ,Inguinal Hernia, Diverticulitis, Bowel obstruction Ectopic Pregnancy,PID/TOA,Ovarian cyst, Ovarian torsion Shingles Lower lobe pneumonia, Retroperitoneal hematoma/abscess/tumor, Epidural abscess, Epidural hematoma  I ordered and reviewed labs.  Urine along with a few bacteria.  CBC without elevated white blood cell count, CMP otherwise unremarkable.  Lipase mildly elevated.  CK within normal limits.  I ordered a CT abdomen pelvis with contrast which shows mild inflammatory changes around the left kidney without obstruction concerning for developing pyelonephritis.  Patient otherwise hemodynamically stable and healthy.  She reports a history of mssa.  Will treat with Duricef, pain medications, antinausea medications.  She is otherwise hemodynamically stable at discharge.  PDMP reviewed during this encounter.   Amount and/or Complexity of Data Reviewed Labs: ordered. Decision-making details documented in ED Course. Radiology: ordered. Decision-making details documented in ED Course.  Risk Prescription drug  management.           Final Clinical Impression(s) / ED Diagnoses Final diagnoses:  Pyelonephritis    Rx / DC Orders ED Discharge Orders     None         Arthor Captain, PA-C 05/03/23 1833    Terrilee Files, MD 05/04/23 1013

## 2023-05-04 LAB — URINE CULTURE: Culture: <10000 — AB

## 2023-10-06 ENCOUNTER — Emergency Department (HOSPITAL_BASED_OUTPATIENT_CLINIC_OR_DEPARTMENT_OTHER)
Admission: EM | Admit: 2023-10-06 | Discharge: 2023-10-06 | Disposition: A | Discharge status: Home / Self Care | Payer: 59 | Attending: Emergency Medicine | Admitting: Emergency Medicine

## 2023-10-06 ENCOUNTER — Other Ambulatory Visit: Payer: Self-pay

## 2023-10-06 ENCOUNTER — Encounter (HOSPITAL_BASED_OUTPATIENT_CLINIC_OR_DEPARTMENT_OTHER): Payer: Self-pay | Admitting: Emergency Medicine

## 2023-10-06 ENCOUNTER — Emergency Department (HOSPITAL_BASED_OUTPATIENT_CLINIC_OR_DEPARTMENT_OTHER): Payer: 59

## 2023-10-06 DIAGNOSIS — R109 Unspecified abdominal pain: Secondary | ICD-10-CM | POA: Diagnosis present

## 2023-10-06 DIAGNOSIS — N12 Tubulo-interstitial nephritis, not specified as acute or chronic: Secondary | ICD-10-CM | POA: Insufficient documentation

## 2023-10-06 LAB — CBC WITH DIFFERENTIAL/PLATELET
Abs Immature Granulocytes: 0.01 10*3/uL (ref 0.00–0.07)
Basophils Absolute: 0 10*3/uL (ref 0.0–0.1)
Basophils Relative: 1 %
Eosinophils Absolute: 0.1 10*3/uL (ref 0.0–0.5)
Eosinophils Relative: 1 %
HCT: 44 % (ref 36.0–46.0)
Hemoglobin: 15 g/dL (ref 12.0–15.0)
Immature Granulocytes: 0 %
Lymphocytes Relative: 26 %
Lymphs Abs: 1.7 10*3/uL (ref 0.7–4.0)
MCH: 32.6 pg (ref 26.0–34.0)
MCHC: 34.1 g/dL (ref 30.0–36.0)
MCV: 95.7 fL (ref 80.0–100.0)
Monocytes Absolute: 0.6 10*3/uL (ref 0.1–1.0)
Monocytes Relative: 9 %
Neutro Abs: 4.2 10*3/uL (ref 1.7–7.7)
Neutrophils Relative %: 63 %
Platelets: 147 10*3/uL — ABNORMAL LOW (ref 150–400)
RBC: 4.6 MIL/uL (ref 3.87–5.11)
RDW: 11 % — ABNORMAL LOW (ref 11.5–15.5)
WBC: 6.6 10*3/uL (ref 4.0–10.5)
nRBC: 0 % (ref 0.0–0.2)

## 2023-10-06 LAB — COMPREHENSIVE METABOLIC PANEL
ALT: 8 U/L (ref 0–44)
AST: 18 U/L (ref 15–41)
Albumin: 3.3 g/dL — ABNORMAL LOW (ref 3.5–5.0)
Alkaline Phosphatase: 75 U/L (ref 38–126)
Anion gap: 6 (ref 5–15)
BUN: 11 mg/dL (ref 6–20)
CO2: 27 mmol/L (ref 22–32)
Calcium: 8.4 mg/dL — ABNORMAL LOW (ref 8.9–10.3)
Chloride: 105 mmol/L (ref 98–111)
Creatinine, Ser: 0.69 mg/dL (ref 0.44–1.00)
GFR, Estimated: >60 mL/min (ref >60)
Glucose, Bld: 70 mg/dL (ref 70–99)
Potassium: 3.8 mmol/L (ref 3.5–5.1)
Sodium: 138 mmol/L (ref 135–145)
Total Bilirubin: 0.4 mg/dL (ref <1.2)
Total Protein: 6.3 g/dL — ABNORMAL LOW (ref 6.5–8.1)

## 2023-10-06 LAB — URINALYSIS, MICROSCOPIC (REFLEX)

## 2023-10-06 LAB — URINALYSIS, ROUTINE W REFLEX MICROSCOPIC
Bilirubin Urine: NEGATIVE
Glucose, UA: NEGATIVE mg/dL
Ketones, ur: NEGATIVE mg/dL
Nitrite: NEGATIVE
Protein, ur: 30 mg/dL — AB
Specific Gravity, Urine: 1.025 (ref 1.005–1.030)
pH: 6 (ref 5.0–8.0)

## 2023-10-06 LAB — PREGNANCY, URINE: Preg Test, Ur: NEGATIVE

## 2023-10-06 LAB — LIPASE, BLOOD: Lipase: 26 U/L (ref 11–51)

## 2023-10-06 MED ORDER — SODIUM CHLORIDE 0.9 % IV SOLN
2.0000 g | Freq: Once | INTRAVENOUS | Status: AC
  Administered 2023-10-06: 2 g via INTRAVENOUS
  Filled 2023-10-06: qty 20

## 2023-10-06 MED ORDER — SODIUM CHLORIDE 0.9 % IV BOLUS
1000.0000 mL | Freq: Once | INTRAVENOUS | Status: AC
  Administered 2023-10-06: 1000 mL via INTRAVENOUS

## 2023-10-06 MED ORDER — MORPHINE SULFATE (PF) 4 MG/ML IV SOLN
4.0000 mg | Freq: Once | INTRAVENOUS | Status: AC
  Administered 2023-10-06: 4 mg via INTRAVENOUS
  Filled 2023-10-06: qty 1

## 2023-10-06 MED ORDER — CEFADROXIL 500 MG PO CAPS: 500.0000 mg | ORAL_CAPSULE | Freq: Two times a day (BID) | ORAL | 0 refills | Status: AC

## 2023-10-06 NOTE — ED Provider Notes (Signed)
[place] [facility] Provider Note   CSN: [id number] Arrival date & time: 10/06/23  1147     History  Chief Complaint  Patient presents with   Back Pain    Junella Kocsis is a 32 y.o. female.  Patient with history of kidney stones presents today with complaints of left flank pain.  She states that same began approximately 1 week ago and has been persistent since then.  She states that she has had kidney stones in the past as well as pyelonephritis.  Feels like when she has had pyelonephritis before.  She states that yesterday she did have a fever of 103.  She has been taking Tylenol and Motrin for this and last took both of these 3 hours prior to arrival today.  Denies nausea or vomiting.  Does note dysuria as well as hematuria.  No vaginal discharge.  The history is provided by the patient. No language interpreter was used.  Back Pain      Home Medications Prior to Admission medications   Medication Sig Start Date End Date Taking? Authorizing Provider  dicyclomine (BENTYL) 20 MG tablet Take 1 tablet (20 mg total) by mouth 3 (three) times daily before meals. As needed 02/01/18   Cristina Gong, PA-C  gabapentin (NEURONTIN) 100 MG capsule Take 100 mg by mouth 3 (three) times daily.    [provider]  HYDROcodone-acetaminophen (NORCO) 5-325 MG tablet Take 1 tablet by mouth every 4 (four) hours as needed for moderate pain. 05/03/23   Harris, Cammy Copa, PA-C  medroxyPROGESTERone (DEPO-PROVERA) 150 MG/ML injection INJECT 1 ML (150 MG TOTAL) INTO THE MUSCLE EVERY 3 (THREE) MONTHS. 01/18/20   Levie Heritage, DO  ondansetron (ZOFRAN-ODT) 4 MG disintegrating tablet 4mg  ODT q4 hours prn nausea/vomit 05/03/23   Harris, Abigail, PA-C  oxcarbazepine (TRILEPTAL) 600 MG tablet Take 600 mg by mouth 2 (two) times daily.    [provider]  pramipexole (MIRAPEX) 0.75 MG tablet Take 0.75 mg by mouth 3 (three) times daily.    [provider]   traZODone (DESYREL) 100 MG tablet Take 100 mg by mouth at bedtime.    [provider]      Allergies    Patient has no active allergies.    Review of Systems   Review of Systems  Musculoskeletal:  Positive for back pain.    Physical Exam Updated Vital Signs BP (!) 108/94   Pulse 62   Temp 97.8 F (36.6 C) (Oral)   Resp 16   Ht 5\' 8"  (1.727 m)   Wt 68 kg   LMP 09/25/2023 Comment: neg preg test 10/06/23  SpO2 100%   BMI 22.81 kg/m  Physical Exam Vitals and nursing note reviewed.  Constitutional:      General: She is not in acute distress.    Appearance: Normal appearance. She is normal weight. She is not ill-appearing, toxic-appearing or diaphoretic.  HENT:     Head: Normocephalic and atraumatic.  Cardiovascular:     Rate and Rhythm: Normal rate.  Pulmonary:     Effort: Pulmonary effort is normal. No respiratory distress.  Abdominal:     General: Abdomen is flat.     Palpations: Abdomen is soft.     Tenderness: There is left CVA tenderness.  Musculoskeletal:        General: Normal range of motion.     Cervical back: Normal range of motion.     Comments: No midline tenderness to palpation of  the cervical, thoracic, or lumbar spine.  No step-offs, lesions, deformity or overlying skin changes.  Skin:    General: Skin is warm and dry.  Neurological:     General: No focal deficit present.     Mental Status: She is alert.  Psychiatric:        Mood and Affect: Mood normal.        Behavior: Behavior normal.     ED Results / Procedures / Treatments   Labs (all labs ordered are listed, but only abnormal results are displayed) Labs Reviewed  CBC WITH DIFFERENTIAL/PLATELET - Abnormal; Notable for the following components:      Result Value   RDW 11.0 (*)    Platelets 147 (*)    All other components within normal limits  URINALYSIS, ROUTINE W REFLEX MICROSCOPIC - Abnormal; Notable for the following components:   Hgb urine dipstick TRACE (*)    Protein, ur  30 (*)    Leukocytes,Ua SMALL (*)    All other components within normal limits  COMPREHENSIVE METABOLIC PANEL - Abnormal; Notable for the following components:   Calcium 8.4 (*)    Total Protein 6.3 (*)    Albumin 3.3 (*)    All other components within normal limits  URINALYSIS, MICROSCOPIC (REFLEX) - Abnormal; Notable for the following components:   Bacteria, UA FEW (*)    All other components within normal limits  URINE CULTURE  PREGNANCY, URINE  LIPASE, BLOOD    EKG None  Radiology CT Renal Stone Study  Result Date: 10/06/2023 CLINICAL DATA:  Abdominal/flank pain, stone suspected. Left side back pain. History of bilateral kidney stones. EXAM: CT ABDOMEN AND PELVIS WITHOUT CONTRAST TECHNIQUE: Multidetector CT imaging of the abdomen and pelvis was performed following the standard protocol without IV contrast. RADIATION DOSE REDUCTION: This exam was performed according to the departmental dose-optimization program which includes automated exposure control, adjustment of the mA and/or kV according to patient size and/or use of iterative reconstruction technique. COMPARISON:  Abdominopelvic CT 07/08/2023 and 05/03/2023. FINDINGS: Lower chest: Clear lung bases. No significant pleural or pericardial effusion. Hepatobiliary: The liver is normal in density without suspicious focal abnormality. Previously demonstrated hemangioma in the right lobe is grossly stable, measuring 1.9 cm on image 25/301. No evidence of gallstones, gallbladder wall thickening or biliary dilatation. Pancreas: Unremarkable. No pancreatic ductal dilatation or surrounding inflammatory changes. Spleen: Normal in size without focal abnormality. Adrenals/Urinary Tract: Both adrenal glands appear normal. Unchanged nonobstructing bilateral renal calculi, measuring 5 mm in the upper pole of the right kidney and 8 mm in the left lower pole infundibulum. No evidence of ureteral calculus, hydronephrosis or perinephric soft tissue  stranding. The bladder appears unremarkable for its degree of distention. Stomach/Bowel: No enteric contrast administered. The stomach appears unremarkable for its degree of distension. No evidence of bowel wall thickening, distention or surrounding inflammatory change. The appendix appears normal. Vascular/Lymphatic: There are no enlarged abdominal or pelvic lymph nodes. No significant vascular findings. Reproductive: The uterus and ovaries appear unremarkable. No adnexal mass. Other: No evidence of abdominal wall mass or hernia. No ascites or pneumoperitoneum. Musculoskeletal: No acute or significant osseous findings. IMPRESSION: 1. No acute findings or explanation for the patient's symptoms. 2. Unchanged nonobstructing bilateral renal calculi. No evidence of ureteral calculus, hydronephrosis or perinephric soft tissue stranding. Previously demonstrated small left UPJ calculus is no longer present. 3. 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 Electronically Signed   By: Carey Bullocks M.D.   On: 10/06/2023  12:55    Procedures Procedures    Medications Ordered in ED Medications  cefTRIAXone (ROCEPHIN) 2 g in sodium chloride 0.9 % 100 mL IVPB (2 g Intravenous New Bag/Given 10/06/23 1434)  morphine (PF) 4 MG/ML injection 4 mg (4 mg Intravenous Given 10/06/23 1226)  sodium chloride 0.9 % bolus 1,000 mL (0 mLs Intravenous Stopped 10/06/23 1426)  morphine (PF) 4 MG/ML injection 4 mg (4 mg Intravenous Given 10/06/23 1429)    ED Course/ Medical Decision Making/ A&P                                 Medical Decision Making Amount and/or Complexity of Data Reviewed Labs: ordered. Radiology: ordered.  Risk Prescription drug management.   This patient is a 32 y.o. female who presents to the ED for concern of flank pain, this involves an extensive number of treatment options, and is a complaint that carries with it a high risk of complications and morbidity. The emergent differential diagnosis  prior to evaluation includes, but is not limited to,  AAA, renal artery/vein embolism/thrombosis, mesenteric ischemia, pyelonephritis, nephrolithiasis, cystitis, biliary colic, pancreatitis, perforated peptic ulcer, appendicitis, diverticulitis, bowel obstruction, Ectopic Pregnancy, PID/TOA, Ovarian cyst, Ovarian torsion   This is not an exhaustive differential.   Past Medical History / Co-morbidities / Social History:  has no past medical history on file.  Additional history: Chart reviewed. Pertinent results include: patient seen on 05/03/23 with similar symptoms, diagnosed with pyelonephritis, given duricef. Culture was contaminated  Physical Exam: Physical exam performed. The pertinent findings include: left sided CVA tenderness.  No midline spinal tenderness  Lab Tests: I ordered, and personally interpreted labs.  The pertinent results include:  no leukocytosis. UA with small leukocytes, few bacteria, 11-20 WBCs, does have 11-20 squams   Imaging Studies: I ordered imaging studies including Ct renal. I independently visualized and interpreted imaging which showed   1. No acute findings or explanation for the patient's symptoms. 2. Unchanged nonobstructing bilateral renal calculi. No evidence of ureteral calculus, hydronephrosis or perinephric soft tissue stranding. Previously demonstrated small left UPJ calculus is no longer present. 3. 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  I agree with the radiologist interpretation.   Medications: I ordered medication including Rocephin for UTI, morphine for pain, fluids for dehydration. Reevaluation of the patient after these medicines showed that the patient improved. I have reviewed the patients home medicines and have made adjustments as needed.  Disposition: After consideration of the diagnostic results and the patients response to treatment, I feel that emergency department workup does not suggest an emergent condition requiring  admission or immediate intervention beyond what has been performed at this time. The plan is: discharge with duricef for UTI with close outpatient follow-up and return precautions. After meds and fluids, patient is feeling better and ready to go home. Her labs, vital signs, and imaging is benign. Evaluation and diagnostic testing in the emergency department does not suggest an emergent condition requiring admission or immediate intervention beyond what has been performed at this time.  Plan for discharge with close PCP follow-up.  Patient is understanding and amenable with plan, educated on red flag symptoms that would prompt immediate return.  Patient discharged in stable condition.  Final Clinical Impression(s) / ED Diagnoses Final diagnoses:  Pyelonephritis    Rx / DC Orders ED Discharge Orders          Ordered  cefadroxil (DURICEF) 500 MG capsule  2 times daily        10/06/23 1517          An After Visit Summary was printed and given to the patient.     Vear Clock 10/06/23 1519    Vanetta Mulders, MD 10/08/23 1718

## 2023-10-06 NOTE — ED Triage Notes (Signed)
Pt c/o pain to bil middle back x 1 week; sts she thinks she has a kidney infection; +dysuria and hematuria

## 2023-10-06 NOTE — Discharge Instructions (Signed)
As we discussed, your workup in the ER today was reassuring for acute findings.  It does look like you may have a urinary tract infection that may be impacting your kidney and causing your pain.  We have treated this with 1 round of IV antibiotics and I have sent you home with [facility] which is an oral antibiotic that you need to take as prescribed in its entirety.  Please continue to take the Tylenol/Motrin for fevers and pain.  Call your primary doctor to schedule follow-up appointment.  Return if development of any new or worsening symptoms.

## 2023-10-08 LAB — URINE CULTURE: Culture: >=100000 — AB

## 2023-10-09 ENCOUNTER — Telehealth (HOSPITAL_BASED_OUTPATIENT_CLINIC_OR_DEPARTMENT_OTHER): Payer: Self-pay | Admitting: *Deleted

## 2023-10-09 NOTE — Telephone Encounter (Signed)
Post ED Visit - Positive Culture Follow-up: Successful Patient Follow-Up  Culture assessed and recommendations reviewed by:  [x]  [name] Pharm.D. []  Celedonio Miyamoto, Pharm.D., BCPS AQ-ID []  Garvin Fila, Pharm.D., BCPS []  Georgina Pillion, Pharm.D., BCPS []  [place], [address].D., BCPS, AAHIVP []  Estella Husk, Pharm.D., BCPS, AAHIVP []  Lysle Pearl, PharmD, BCPS []  Phillips Climes, PharmD, BCPS []  Agapito Games, PharmD, BCPS []  Verlan Friends, PharmD  Positive urine culture  []  Patient discharged without antimicrobial prescription and treatment is now indicated [x]  Organism is resistant to prescribed ED discharge antimicrobial []  Patient with positive blood cultures  Changes discussed with ED provider: Margarita Grizzle New antibiotic prescription Stop taking Cefadroxil and start Amoxcillin 500mg  TID x5 days  Called to [facility] in [place]  Contacted patient, date 10/09/23, time 1218   Bing Quarry 10/09/2023, 12:17 PM

## 2023-10-09 NOTE — Progress Notes (Signed)
ED Antimicrobial Stewardship Positive Culture Follow Up   Carolyn Deleon is an 32 y.o. female who presented to [facility] on 10/06/2023 with a chief complaint of  Chief Complaint  Patient presents with   Back Pain    Recent Results (from the past 720 hour(s))  Urine Culture     Status: Abnormal   Collection Time: 10/06/23 12:08 PM   Specimen: Urine, Clean Catch  Result Value Ref Range Status   Specimen Description   Final    URINE, CLEAN CATCH Performed at [facility], [address] [place], [place] [address]    Special Requests   Final    NONE Performed at [facility], [address] [place], [place] [address]    Culture >=100,000 COLONIES/mL ENTEROCOCCUS FAECALIS (A)  Final   Report Status 10/08/2023 FINAL  Final   Organism ID, Bacteria ENTEROCOCCUS FAECALIS (A)  Final      Susceptibility   Enterococcus faecalis - MIC*    AMPICILLIN <=2 SENSITIVE Sensitive     NITROFURANTOIN <=16 SENSITIVE Sensitive     VANCOMYCIN 1 SENSITIVE Sensitive     * >=100,000 COLONIES/mL ENTEROCOCCUS FAECALIS    [x]  Treated with cefadroxil, organism resistant to prescribed antimicrobial []  Patient discharged originally without antimicrobial agent and treatment is now indicated  New antibiotic prescription: Amoxil  ED Provider: Margarita Grizzle, MD   [name] 10/09/2023, 7:43 AM Clinical Pharmacist Monday - Friday phone -  [phone] Saturday - Sunday phone - [phone]

## 2023-10-10 ENCOUNTER — Encounter (HOSPITAL_BASED_OUTPATIENT_CLINIC_OR_DEPARTMENT_OTHER): Payer: Self-pay

## 2023-10-10 ENCOUNTER — Emergency Department (HOSPITAL_BASED_OUTPATIENT_CLINIC_OR_DEPARTMENT_OTHER): Payer: 59

## 2023-10-10 ENCOUNTER — Emergency Department (HOSPITAL_BASED_OUTPATIENT_CLINIC_OR_DEPARTMENT_OTHER)
Admission: EM | Admit: 2023-10-10 | Discharge: 2023-10-11 | Disposition: A | Discharge status: Home / Self Care | Payer: 59 | Attending: Emergency Medicine | Admitting: Emergency Medicine

## 2023-10-10 ENCOUNTER — Other Ambulatory Visit: Payer: Self-pay

## 2023-10-10 DIAGNOSIS — R300 Dysuria: Secondary | ICD-10-CM | POA: Diagnosis present

## 2023-10-10 DIAGNOSIS — R109 Unspecified abdominal pain: Secondary | ICD-10-CM

## 2023-10-10 DIAGNOSIS — N390 Urinary tract infection, site not specified: Secondary | ICD-10-CM | POA: Diagnosis not present

## 2023-10-10 MED ORDER — SODIUM CHLORIDE 0.9 % IV SOLN
2.0000 g | Freq: Once | INTRAVENOUS | Status: AC
  Administered 2023-10-11: 2 g via INTRAVENOUS

## 2023-10-10 MED ORDER — ONDANSETRON HCL 4 MG/2ML IJ SOLN
4.0000 mg | Freq: Once | INTRAMUSCULAR | Status: AC
  Administered 2023-10-11: 4 mg via INTRAVENOUS
  Filled 2023-10-10: qty 2

## 2023-10-10 MED ORDER — FENTANYL CITRATE PF 50 MCG/ML IJ SOSY
50.0000 ug | PREFILLED_SYRINGE | Freq: Once | INTRAMUSCULAR | Status: AC
  Administered 2023-10-11: 50 ug via INTRAVENOUS
  Filled 2023-10-10: qty 1

## 2023-10-10 NOTE — ED Triage Notes (Signed)
Seen on Sunday for Pyelo; reports not getting better even though taking abx. Urology app scheduled for Monday. Pt passed out in lobby when walking in. Pt is shaking during triage. Temp 100 at home; feeling very dizzy.

## 2023-10-10 NOTE — ED Provider Notes (Signed)
[facility] Provider Note   CSN: [id number] Arrival date & time: 10/10/23  2308     History  Chief Complaint  Patient presents with   Dizziness   Dysuria    Carolyn Deleon is a 32 y.o. female.  The history is provided by the patient.  Dizziness Dysuria Carolyn Deleon is a 32 y.o. female who presents to the Emergency Department complaining of flank pain.  She presents to the emergency department for evaluation of flank pain with dysuria, foul-smelling urine, diarrhea and nausea.  She has had 5-6 episodes of diarrhea daily.  She had a temperature to 102 earlier today.  She was recently seen on Sunday for pyelonephritis and started on Duricef.  She was contacted by the pharmacy regarding antibiotic resistance and her medication was changed to amoxicillin 2 days ago.  She is able to take these medications.  She is also taking Tylenol and Motrin as needed pain.  She is scheduled to follow-up with urology on Monday.  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   Hx/o psoriatic arthritis - takes zeljamz for one year.     Home Medications Prior to Admission medications   Medication Sig Start Date End Date Taking? Authorizing Provider  amoxicillin (AMOXIL) 500 MG capsule Take 1 capsule (500 mg total) by mouth 3 (three) times daily. 10/11/23  Yes Tilden Fossa, MD  ondansetron (ZOFRAN-ODT) 4 MG disintegrating tablet Take 1 tablet (4 mg total) by mouth every 8 (eight) hours as needed. 10/11/23  Yes Tilden Fossa, MD  oxyCODONE-acetaminophen (PERCOCET/ROXICET) 5-325 MG tablet Take 1 tablet by mouth every 6 (six) hours as needed for severe pain (pain score 7-10). 10/11/23  Yes Tilden Fossa, MD  cefadroxil (DURICEF) 500 MG capsule Take 1 capsule (500 mg total) by mouth 2 (two) times daily for 7 days. 10/06/23 10/13/23  Smoot, Shawn Route, PA-C  dicyclomine (BENTYL) 20 MG tablet Take 1 tablet (20 mg total) by mouth 3 (three) times daily before  meals. As needed 02/01/18   Cristina Gong, PA-C  gabapentin (NEURONTIN) 100 MG capsule Take 100 mg by mouth 3 (three) times daily.    [provider]  HYDROcodone-acetaminophen (NORCO) 5-325 MG tablet Take 1 tablet by mouth every 4 (four) hours as needed for moderate pain. 05/03/23   Harris, Cammy Copa, PA-C  medroxyPROGESTERone (DEPO-PROVERA) 150 MG/ML injection INJECT 1 ML (150 MG TOTAL) INTO THE MUSCLE EVERY 3 (THREE) MONTHS. 01/18/20   Levie Heritage, DO  oxcarbazepine (TRILEPTAL) 600 MG tablet Take 600 mg by mouth 2 (two) times daily.    [provider]  pramipexole (MIRAPEX) 0.75 MG tablet Take 0.75 mg by mouth 3 (three) times daily.    [provider]  traZODone (DESYREL) 100 MG tablet Take 100 mg by mouth at bedtime.    [provider]      Allergies    Patient has no known allergies.    Review of Systems   Review of Systems  Genitourinary:  Positive for dysuria.  Neurological:  Positive for dizziness.  All other systems reviewed and are negative.   Physical Exam Updated Vital Signs BP (!) 101/50   Pulse (!) 57   Temp 98.1 F (36.7 C) (Oral)   Resp 16   Ht 5\' 8"  (1.727 m)   Wt 70.3 kg   LMP 09/25/2023 Comment: neg preg test 10/06/23  SpO2 99%   BMI 23.57 kg/m  Physical Exam Vitals and nursing note reviewed.  Constitutional:  Appearance: She is well-developed.  HENT:     Head: Normocephalic and atraumatic.  Cardiovascular:     Rate and Rhythm: Normal rate and regular rhythm.     Heart sounds: No murmur heard. Pulmonary:     Effort: Pulmonary effort is normal. No respiratory distress.     Breath sounds: Normal breath sounds.  Abdominal:     Palpations: Abdomen is soft.     Tenderness: There is no guarding or rebound.     Comments: Generalized abdominal tenderness  Musculoskeletal:        General: No tenderness.  Skin:    General: Skin is warm and dry.     Capillary Refill: Capillary refill takes less than 2 seconds.   Neurological:     Mental Status: She is alert and oriented to person, place, and time.  Psychiatric:        Behavior: Behavior normal.     ED Results / Procedures / Treatments   Labs (all labs ordered are listed, but only abnormal results are displayed) Labs Reviewed  COMPREHENSIVE METABOLIC PANEL - Abnormal; Notable for the following components:      Result Value   Glucose, Bld 109 (*)    Total Protein 6.4 (*)    All other components within normal limits  URINALYSIS, W/ REFLEX TO CULTURE (INFECTION SUSPECTED) - Abnormal; Notable for the following components:   APPearance CLOUDY (*)    Hgb urine dipstick MODERATE (*)    Protein, ur 30 (*)    Leukocytes,Ua SMALL (*)    Bacteria, UA MANY (*)    All other components within normal limits  CBC WITH DIFFERENTIAL/PLATELET - Abnormal; Notable for the following components:   RDW 11.0 (*)    All other components within normal limits  CULTURE, BLOOD (ROUTINE X 2)  CULTURE, BLOOD (ROUTINE X 2)  URINE CULTURE  LACTIC ACID, PLASMA  PREGNANCY, URINE  CBC WITH DIFFERENTIAL/PLATELET    EKG EKG Interpretation Date/Time:  Thursday October 10 2023 23:17:53 EST Ventricular Rate:  89 PR Interval:  190 QRS Duration:  92 QT Interval:  359 QTC Calculation: 437 R Axis:   32  Text Interpretation: Sinus rhythm Low voltage, precordial leads Confirmed by Tilden Fossa [phone]) on 10/10/2023 11:22:19 PM  Radiology CT ABDOMEN PELVIS W CONTRAST Result Date: 10/11/2023 CLINICAL DATA:  Sepsis, left flank pain.  Recent pyelonephritis. EXAM: CT ABDOMEN AND PELVIS WITH CONTRAST TECHNIQUE: Multidetector CT imaging of the abdomen and pelvis was performed using the standard protocol following bolus administration of intravenous contrast. RADIATION DOSE REDUCTION: This exam was performed according to the departmental dose-optimization program which includes automated exposure control, adjustment of the mA and/or kV according to patient size and/or use of  iterative reconstruction technique. CONTRAST:  OMNIPAQUE IOHEXOL 300 MG/ML  SOLN COMPARISON:  10/06/2023 FINDINGS: Lower chest: No acute abnormality Hepatobiliary: No suspicious focal hepatic abnormality. Small hemangioma in the right hepatic lobe is unchanged. Gallbladder unremarkable. Pancreas: No focal abnormality or ductal dilatation. Spleen: No focal abnormality.  Normal size. Adrenals/Urinary Tract: Adrenal glands normal. 6 mm stone in the lower pole E left kidney. 4 mm stone in the lower pole of the right kidney. These are unchanged since prior study. No ureteral stones or hydronephrosis. Normal enhancement of the kidneys. No perinephric fluid collection. Urinary bladder unremarkable. Stomach/Bowel: Stomach, large and small bowel grossly unremarkable. Normal appendix. Vascular/Lymphatic: No evidence of aneurysm or adenopathy. Reproductive: Uterus and adnexa unremarkable.  No mass. Other: No free fluid or free air. Musculoskeletal: No  acute bony abnormality. IMPRESSION: Bilateral nephrolithiasis, unchanged.  No hydronephrosis. Normal enhancement of the kidneys.  No perinephric fluid collection. No acute findings. Electronically Signed   By: Charlett Nose M.D.   On: 10/11/2023 01:28   DG Chest Port 1 View Result Date: 10/10/2023 CLINICAL DATA:  Possible sepsis EXAM: PORTABLE CHEST 1 VIEW COMPARISON:  11/21/2022 FINDINGS: The heart size and mediastinal contours are within normal limits. Both lungs are clear. The visualized skeletal structures are unremarkable. IMPRESSION: No active disease. Electronically Signed   By: Alcide Clever M.D.   On: 10/10/2023 23:47    Procedures Procedures    Medications Ordered in ED Medications  ondansetron (ZOFRAN) injection 4 mg (4 mg Intravenous Given 10/11/23 0023)  fentaNYL (SUBLIMAZE) injection 50 mcg (50 mcg Intravenous Given 10/11/23 0025)  ampicillin (OMNIPEN) 2 g in sodium chloride 0.9 % 100 mL IVPB (0 g Intravenous Stopped 10/11/23 0054)  iohexol  (OMNIPAQUE) 300 MG/ML solution 100 mL (100 mLs Intravenous Contrast Given 10/11/23 0108)    ED Course/ Medical Decision Making/ A&P                                 Medical Decision Making Amount and/or Complexity of Data Reviewed Labs: ordered. Radiology: ordered.  Risk Prescription drug management.   Patient with history of psoriatic arthritis on biologic, recurrent pyelonephritis here for evaluation of flank pain, dysuria.  She did have a fever at home, afebrile in the emergency department.  She does have abdominal tenderness without peritoneal findings.  She did have a episode at time of ED presentation where she became weak and was assisted to a wheelchair.  She was through his pain medications in the emergency department, antibiotics based on prior sensitivities.  Labs are stable with stable renal function, CBC.  UA is concerning for ongoing UTI.  Will send additional culture to evaluate for developed resistance.  No evidence of sepsis.  A CT scan was obtained, negative for perinephric abscess.  She does have nephrolithiasis without any evidence of obstruction.  No vomiting in the department.  Feel she is stable to discharge with continued oral medications with outpatient follow-up and return precautions.        Final Clinical Impression(s) / ED Diagnoses Final diagnoses:  Lower urinary tract infectious disease  Flank pain    Rx / DC Orders ED Discharge Orders          Ordered    amoxicillin (AMOXIL) 500 MG capsule  3 times daily        10/11/23 0500    ondansetron (ZOFRAN-ODT) 4 MG disintegrating tablet  Every 8 hours PRN        10/11/23 0500    oxyCODONE-acetaminophen (PERCOCET/ROXICET) 5-325 MG tablet  Every 6 hours PRN        10/11/23 0503              Tilden Fossa, MD 10/11/23 [phone]

## 2023-10-10 NOTE — ED Notes (Signed)
Pt passed out in the lobby when walking to ED with visitor. Pt being held by visitor. Pt placed on wheelchair and brought to a room. Pt shaking and weak; needs 1 person assist to wheelchair and bed.

## 2023-10-11 ENCOUNTER — Emergency Department (HOSPITAL_BASED_OUTPATIENT_CLINIC_OR_DEPARTMENT_OTHER): Payer: 59

## 2023-10-11 LAB — COMPREHENSIVE METABOLIC PANEL
ALT: 10 U/L (ref 0–44)
AST: 16 U/L (ref 15–41)
Albumin: 3.6 g/dL (ref 3.5–5.0)
Alkaline Phosphatase: 80 U/L (ref 38–126)
Anion gap: 9 (ref 5–15)
BUN: 13 mg/dL (ref 6–20)
CO2: 25 mmol/L (ref 22–32)
Calcium: 9.6 mg/dL (ref 8.9–10.3)
Chloride: 105 mmol/L (ref 98–111)
Creatinine, Ser: 0.69 mg/dL (ref 0.44–1.00)
GFR, Estimated: >60 mL/min (ref >60)
Glucose, Bld: 109 mg/dL — ABNORMAL HIGH (ref 70–99)
Potassium: 3.7 mmol/L (ref 3.5–5.1)
Sodium: 139 mmol/L (ref 135–145)
Total Bilirubin: 0.5 mg/dL (ref <1.2)
Total Protein: 6.4 g/dL — ABNORMAL LOW (ref 6.5–8.1)

## 2023-10-11 LAB — URINALYSIS, W/ REFLEX TO CULTURE (INFECTION SUSPECTED)
Bilirubin Urine: NEGATIVE
Glucose, UA: NEGATIVE mg/dL
Ketones, ur: NEGATIVE mg/dL
Nitrite: NEGATIVE
Protein, ur: 30 mg/dL — AB
Specific Gravity, Urine: 1.025 (ref 1.005–1.030)
pH: 6 (ref 5.0–8.0)

## 2023-10-11 LAB — CBC WITH DIFFERENTIAL/PLATELET
Abs Immature Granulocytes: 0.03 10*3/uL (ref 0.00–0.07)
Basophils Absolute: 0 10*3/uL (ref 0.0–0.1)
Basophils Relative: 1 %
Eosinophils Absolute: 0.1 10*3/uL (ref 0.0–0.5)
Eosinophils Relative: 2 %
HCT: 37.8 % (ref 36.0–46.0)
Hemoglobin: 13.3 g/dL (ref 12.0–15.0)
Immature Granulocytes: 0 %
Lymphocytes Relative: 30 %
Lymphs Abs: 2.6 10*3/uL (ref 0.7–4.0)
MCH: 33 pg (ref 26.0–34.0)
MCHC: 35.2 g/dL (ref 30.0–36.0)
MCV: 93.8 fL (ref 80.0–100.0)
Monocytes Absolute: 0.9 10*3/uL (ref 0.1–1.0)
Monocytes Relative: 11 %
Neutro Abs: 5 10*3/uL (ref 1.7–7.7)
Neutrophils Relative %: 56 %
Platelets: 242 10*3/uL (ref 150–400)
RBC: 4.03 MIL/uL (ref 3.87–5.11)
RDW: 11 % — ABNORMAL LOW (ref 11.5–15.5)
WBC: 8.7 10*3/uL (ref 4.0–10.5)
nRBC: 0 % (ref 0.0–0.2)

## 2023-10-11 LAB — LACTIC ACID, PLASMA: Lactic Acid, Venous: 1.2 mmol/L (ref 0.5–1.9)

## 2023-10-11 LAB — PREGNANCY, URINE: Preg Test, Ur: NEGATIVE

## 2023-10-11 MED ORDER — IOHEXOL 300 MG/ML  SOLN
100.0000 mL | Freq: Once | INTRAMUSCULAR | Status: AC | PRN
  Administered 2023-10-11: 100 mL via INTRAVENOUS

## 2023-10-11 MED ORDER — ONDANSETRON 4 MG PO TBDP: 4.0000 mg | ORAL_TABLET | Freq: Three times a day (TID) | ORAL | 0 refills | Status: AC | PRN

## 2023-10-11 MED ORDER — AMOXICILLIN 500 MG PO CAPS
500.0000 mg | ORAL_CAPSULE | Freq: Three times a day (TID) | ORAL | 0 refills | Status: AC
Start: 2023-10-11 — End: ?

## 2023-10-11 MED ORDER — OXYCODONE-ACETAMINOPHEN 5-325 MG PO TABS: 1.0000 | ORAL_TABLET | Freq: Four times a day (QID) | ORAL | 0 refills | Status: AC | PRN

## 2023-10-11 NOTE — ED Notes (Signed)
Pt was dx with Pyelonephritis on Sunday started on antibiotics and has felt worse.  Saw PCP today and was prescribed another antibiotic and referral to urologist.  C/o a lot of back pain and dizziness.  Pt had a syncopal episode coming into lobby and immediately brought to a room.

## 2023-10-13 LAB — URINE CULTURE: Culture: >=100000 — AB

## 2023-10-14 ENCOUNTER — Telehealth (HOSPITAL_BASED_OUTPATIENT_CLINIC_OR_DEPARTMENT_OTHER): Payer: Self-pay | Admitting: *Deleted

## 2023-10-14 NOTE — Telephone Encounter (Signed)
Post ED Visit - Positive Culture Follow-up  Culture report reviewed by antimicrobial stewardship pharmacist: [facility] [x]  [name], Pharm.D. []  Celedonio Miyamoto, Pharm.D., BCPS AQ-ID []  Garvin Fila, Pharm.D., BCPS []  Georgina Pillion, Pharm.D., BCPS []  [place], [address].D., BCPS, AAHIVP []  Estella Husk, Pharm.D., BCPS, AAHIVP []  Lysle Pearl, PharmD, BCPS []  Phillips Climes, PharmD, BCPS []  Agapito Games, PharmD, BCPS []  Verlan Friends, PharmD []  Mervyn Gay, PharmD, BCPS []  Vinnie Level, PharmD  [facility] []  Len Childs, PharmD []  Greer Pickerel, PharmD []  Adalberto Cole, PharmD []  Perlie Gold, Rph []  Lonell Face) Jean Rosenthal, PharmD []  Earl Many, PharmD []  Junita Push, PharmD []  Dorna Leitz, PharmD []  Terrilee Files, PharmD []  Lynann Beaver, PharmD []  Keturah Barre, PharmD []  Loralee Pacas, PharmD []  Bernadene Person, PharmD   Positive urine culture Treated with Amoxicillin, organism sensitive to the same and no further patient follow-up is required at this time.  Virl A[facility] 10/14/2023, 7:31 AM

## 2023-10-16 LAB — CULTURE, BLOOD (ROUTINE X 2)
Culture: NO GROWTH
Culture: NO GROWTH
Special Requests: ADEQUATE
Special Requests: ADEQUATE
